# Patient Record
Sex: Female | Born: 1975 | Hispanic: No | Marital: Married | Smoking: Never smoker
Health system: Southern US, Community
[De-identification: ages and names within clinical notes are randomized; demographics above are authoritative.]

## PROBLEM LIST (undated history)

## (undated) ENCOUNTER — Inpatient Hospital Stay (HOSPITAL_COMMUNITY): Payer: Self-pay

## (undated) DIAGNOSIS — O24419 Gestational diabetes mellitus in pregnancy, unspecified control: Secondary | ICD-10-CM

## (undated) HISTORY — PX: INCISION AND DRAINAGE BREAST ABSCESS: SUR672

## (undated) HISTORY — PX: BREAST SURGERY: SHX581

---

## 2010-11-12 DIAGNOSIS — O24419 Gestational diabetes mellitus in pregnancy, unspecified control: Secondary | ICD-10-CM

## 2010-11-12 HISTORY — DX: Gestational diabetes mellitus in pregnancy, unspecified control: O24.419

## 2013-02-01 ENCOUNTER — Encounter (HOSPITAL_COMMUNITY): Payer: Self-pay | Admitting: *Deleted

## 2013-02-01 ENCOUNTER — Emergency Department (INDEPENDENT_AMBULATORY_CARE_PROVIDER_SITE_OTHER)
Admission: EM | Admit: 2013-02-01 | Discharge: 2013-02-01 | Disposition: A | Discharge status: Home / Self Care | Payer: Self-pay | Source: Home / Self Care | Attending: Family Medicine | Admitting: Family Medicine

## 2013-02-01 DIAGNOSIS — S335XXA Sprain of ligaments of lumbar spine, initial encounter: Secondary | ICD-10-CM

## 2013-02-01 DIAGNOSIS — S39012A Strain of muscle, fascia and tendon of lower back, initial encounter: Secondary | ICD-10-CM

## 2013-02-01 MED ORDER — METAXALONE 800 MG PO TABS: 800.0000 mg | ORAL_TABLET | Freq: Three times a day (TID) | ORAL | Status: DC

## 2013-02-01 NOTE — ED Notes (Signed)
Pt  Reports       Pain l  Side       Hurts      When  She  Bends     And    Picks  Something up         She  denys  Any injury  She  denys  Any  Urinary  Symptoms           She  Is  Sitting  Upright on  Exam table  Speaking in  Complete  sentances

## 2013-02-01 NOTE — ED Provider Notes (Signed)
History     CSN: [id number]  Arrival date & time 02/01/13  1211   First MD Initiated Contact with Patient 02/01/13 1218      Chief Complaint  Patient presents with  . Back Pain    (Consider location/radiation/quality/duration/timing/severity/associated sxs/prior treatment) Patient is a 37 y.o. female presenting with back pain. The history is provided by the patient.  Back Pain Location:  Sacro-iliac joint Quality:  Burning and stiffness Radiates to:  Does not radiate Pain severity:  Mild Duration:  3 days Progression:  Unchanged Context comment:  Onset when cleaning house.   History reviewed. No pertinent past medical history.  History reviewed. No pertinent past surgical history.  History reviewed. No pertinent family history.  History  Substance Use Topics  . Smoking status: Never Smoker   . Smokeless tobacco: Not on file  . Alcohol Use: No    OB History   Grav Para Term Preterm Abortions TAB SAB Ect Mult Living                  Review of Systems  Constitutional: Negative.   Gastrointestinal: Negative.   Genitourinary: Negative.   Musculoskeletal: Positive for back pain. Negative for myalgias.  Skin: Negative.     Allergies  Review of patient's allergies indicates not on file.  Home Medications   Current Outpatient Rx  Name  Route  Sig  Dispense  Refill  . metaxalone (SKELAXIN) 800 MG tablet   Oral   Take 1 tablet (800 mg total) by mouth 3 (three) times daily.   21 tablet   0     BP 124/82  Pulse 70  Temp(Src) 98.4 F (36.9 C) (Oral)  SpO2 100%  Physical Exam  Nursing note and vitals reviewed. Constitutional: She is oriented to person, place, and time. She appears well-developed and well-nourished.  Neck: Normal range of motion. Neck supple.  Abdominal: Soft. Bowel sounds are normal.  Musculoskeletal: She exhibits tenderness.       Lumbar back: She exhibits tenderness and spasm. She exhibits normal range of motion, no bony tenderness,  no pain and normal pulse.       Back:  Neurological: She is alert and oriented to person, place, and time.  Skin: Skin is warm and dry.    ED Course  Procedures (including critical care time)  Labs Reviewed - No data to display No results found.   1. Lumbar strain, initial encounter       MDM          Linna Hoff, MD 02/01/13 1345

## 2013-07-23 ENCOUNTER — Encounter (HOSPITAL_COMMUNITY): Payer: Self-pay | Admitting: Emergency Medicine

## 2013-07-23 ENCOUNTER — Emergency Department (HOSPITAL_COMMUNITY)
Admission: EM | Admit: 2013-07-23 | Discharge: 2013-07-23 | Disposition: A | Discharge status: Home / Self Care | Payer: No Typology Code available for payment source | Source: Home / Self Care | Attending: Family Medicine | Admitting: Family Medicine

## 2013-07-23 DIAGNOSIS — N63 Unspecified lump in unspecified breast: Secondary | ICD-10-CM

## 2013-07-23 DIAGNOSIS — M7710 Lateral epicondylitis, unspecified elbow: Secondary | ICD-10-CM

## 2013-07-23 DIAGNOSIS — N6320 Unspecified lump in the left breast, unspecified quadrant: Secondary | ICD-10-CM

## 2013-07-23 DIAGNOSIS — M7712 Lateral epicondylitis, left elbow: Secondary | ICD-10-CM

## 2013-07-23 LAB — POCT URINALYSIS DIP (DEVICE)
Bilirubin Urine: NEGATIVE
Hgb urine dipstick: NEGATIVE
Nitrite: NEGATIVE
Specific Gravity, Urine: >=1.03 (ref 1.005–1.030)
Urobilinogen, UA: 0.2 mg/dL (ref 0.0–1.0)
pH: 5.5 (ref 5.0–8.0)

## 2013-07-23 MED ORDER — MELOXICAM 15 MG PO TABS: 15.0000 mg | ORAL_TABLET | Freq: Every day | ORAL | Status: DC | PRN

## 2013-07-23 MED ORDER — DOXYCYCLINE HYCLATE 100 MG PO CAPS: 100.0000 mg | ORAL_CAPSULE | Freq: Two times a day (BID) | ORAL | Status: DC

## 2013-07-23 NOTE — ED Notes (Signed)
Pt c/o mass on left side of breast onset 2 days Sxs include pain that increases when she puts pressure on it... Denies breast d/c Also c/o left arm pain onset 2 days as well Pain increases when lifting heavy objects Denies: SOB, HA, edema, CP, inj/trauma, strenuous activity Alert w/no signs of acute distress.

## 2013-07-23 NOTE — ED Provider Notes (Signed)
Shannon Dennis is a 37 y.o. female who presents to Urgent Care today for left breast pain and left lateral elbow pain.  1) patient noted a breast mass about 2 days ago. It is tender and on the lateral aspect of her left breast. She denies any injury. She denies any nipple discharge. She feels well otherwise with no chest pain palpitations or shortness of breath. She's never had a mammogram or breast mass before.  2) left elbow pain: Patient has a few days of left lateral elbow pain. The pain is worse with wrist extension and hand motion. She denies any radiating pain weakness or numbness. He has not used any medications for this yet.    History reviewed. No pertinent past medical history. History  Substance Use Topics  . Smoking status: Never Smoker   . Smokeless tobacco: Not on file  . Alcohol Use: No   ROS as above Medications reviewed. No current facility-administered medications for this encounter.   Current Outpatient Prescriptions  Medication Sig Dispense Refill  . doxycycline (VIBRAMYCIN) 100 MG capsule Take 1 capsule (100 mg total) by mouth 2 (two) times daily.  20 capsule  0  . meloxicam (MOBIC) 15 MG tablet Take 1 tablet (15 mg total) by mouth daily as needed for pain.  14 tablet  0    Exam:  BP 134/85  Pulse 84  Temp(Src) 98.4 F (36.9 C) (Oral)  Resp 16  SpO2 99% Gen: Well NAD HEENT: EOMI,  MMM Lungs: CTABL Nl WOB Breast: Symmetrical appearing bilaterally. Large firm nontender mobile mildly tender mass in the left breast. It occurs at approximately the 4:00 position. Mildly tender.  Heart: RRR no MRG Abd: NABS, NT, ND Exts: Non edematous BL  LE, warm and well perfused.  Left elbow: Normal-appearing. Tender palpation of the lateral epicondyles. Pain with resisted wrist extension Capillary Refill and sensation grip strength are intact bilateral upper extremities  Results for orders placed during the hospital encounter of 07/23/13 (from the past 24 hour(s))  POCT  URINALYSIS DIP (DEVICE)     Status: None   Collection Time    07/23/13 12:43 PM      Result Value Range   Glucose, UA NEGATIVE  NEGATIVE mg/dL   Bilirubin Urine NEGATIVE  NEGATIVE   Ketones, ur NEGATIVE  NEGATIVE mg/dL   Specific Gravity, Urine >=1.030  1.005 - 1.030   Hgb urine dipstick NEGATIVE  NEGATIVE   pH 5.5  5.0 - 8.0   Protein, ur NEGATIVE  NEGATIVE mg/dL   Urobilinogen, UA 0.2  0.0 - 1.0 mg/dL   Nitrite NEGATIVE  NEGATIVE   Leukocytes, UA NEGATIVE  NEGATIVE  POCT PREGNANCY, URINE     Status: None   Collection Time    07/23/13 12:54 PM      Result Value Range   Preg Test, Ur NEGATIVE  NEGATIVE   No results found.  Twelve-lead EKG shows normal sinus rhythm at 79 beats per minute. No significant abnormalities noted.  Assessment and Plan: 37 y.o. female with  1)  Breast mass: Schedule patient for a diagnostic mammogram and ultrasound to evaluate the cause of the breast mass.Marland Kitchen it may be abscess or cellulitis. Plan to treat with doxycycline.  2) left lateral epicondylitis: Plan to treat with meloxicam.  Followup as needed Discussed warning signs or symptoms. Please see discharge instructions. Patient expresses understanding.      Rodolph Bong, MD 07/23/13 1308

## 2013-07-30 ENCOUNTER — Other Ambulatory Visit (HOSPITAL_COMMUNITY): Payer: Self-pay | Admitting: Family Medicine

## 2013-07-30 DIAGNOSIS — N6320 Unspecified lump in the left breast, unspecified quadrant: Secondary | ICD-10-CM

## 2013-07-30 NOTE — ED Notes (Signed)
I called the [facility] and they said they got the verbal order from Dr. Denyse Amass and they entered it but he needs to sign it. I told her I would ask him to sign it.  I called Dr. Denyse Amass and he said it was not in his work que to sign.  I told him I would fill out our paper order and he said he would come back  to sign it.  He signed it and I faxed it to [phone] and confirmation received. [name] 07/30/2013

## 2013-07-30 NOTE — ED Notes (Signed)
Pt. came to the [facility] and said her husband went to the pharmacy and they told him we did not e-prescribe her Rx.'s She also got a new phone number and has not been called for a mammogram appt.  I accessed pt.'s chart and saw that pt. got printed Rx.'s for Mobic and Doxcycline said normal but no pharmacy.  I told registration clerk Clotilde Dieter to give her the number of the [facility] and instruct her to call them, because they most likely got the order, but were unable to contact her.  I asked her if she got a purple paper with and Rx on the back with her discharge instructions. She said yes. I told her that is the Rx. she takes to the pharmacy. I instructed to call the [facility] and schedule an appt. If any further problems with her Rx.'s or mammogram appt. to call back. [name] 07/30/2013

## 2013-08-13 ENCOUNTER — Other Ambulatory Visit: Payer: Self-pay

## 2013-08-18 ENCOUNTER — Ambulatory Visit (HOSPITAL_COMMUNITY)
Admission: RE | Admit: 2013-08-18 | Discharge: 2013-08-18 | Disposition: A | Discharge status: Home / Self Care | Payer: Self-pay | Source: Ambulatory Visit | Attending: Obstetrics and Gynecology | Admitting: Obstetrics and Gynecology

## 2013-08-18 ENCOUNTER — Encounter (HOSPITAL_COMMUNITY): Payer: Self-pay

## 2013-08-18 VITALS — BP 118/70 | Temp 99.0°F | Ht 62.99 in | Wt 180.8 lb

## 2013-08-18 DIAGNOSIS — Z1239 Encounter for other screening for malignant neoplasm of breast: Secondary | ICD-10-CM

## 2013-08-18 NOTE — Patient Instructions (Signed)
Taught Shannon Dennis how to perform BSE and gave educational materials to take home. Patient did not need a Pap smear today due to last Pap smear was March 2014 per patient. Let her know [facility] will cover Pap smears every 3 years unless has a history of abnormal Pap smears. Referred patient to the [facility] for diagnostic mammogram and left breast ultrasound. Appointment scheduled for Thursday, August 20, 2013 at 1515. Patient aware of appointment and will be there. [name] verbalized understanding.  [name], [name], RN 4:47 PM

## 2013-08-18 NOTE — Progress Notes (Signed)
Complaints of left breast lump x 2-3 weeks that per patient feels a mild pain if pushed.  Pap Smear:    Pap smear not completed today. Last Pap smear was March 2014 at the [facility] and normal per patient. Per patient has no history of an abnormal Pap smear. No Pap smear results in EPIC.  Physical exam: Breasts Right breast larger than left breast. No skin abnormalities bilateral breasts. No nipple retraction right breast. Left nipple slightly inverted. No nipple discharge bilateral breasts. No lymphadenopathy. No lumps palpated right breast. Palpated a lump within the left breast at 3 o'clock 2 cm from the nipple.  No complaints of pain or tenderness on exam. Referred patient to the [facility] for diagnostic mammogram and left breast ultrasound. Appointment scheduled for Thursday, August 20, 2013 at 1515.  Pelvic/Bimanual No Pap smear completed today since last Pap smear was March 2014 per patient. Pap smear not indicated per [facility] guidelines.

## 2013-08-20 ENCOUNTER — Other Ambulatory Visit (HOSPITAL_COMMUNITY): Payer: Self-pay | Admitting: Family Medicine

## 2013-08-20 ENCOUNTER — Ambulatory Visit
Admission: RE | Admit: 2013-08-20 | Discharge: 2013-08-20 | Disposition: A | Discharge status: Home / Self Care | Payer: No Typology Code available for payment source | Source: Ambulatory Visit | Attending: Family Medicine | Admitting: Family Medicine

## 2013-08-20 DIAGNOSIS — N6320 Unspecified lump in the left breast, unspecified quadrant: Secondary | ICD-10-CM

## 2013-08-24 ENCOUNTER — Other Ambulatory Visit: Payer: Self-pay | Admitting: Obstetrics and Gynecology

## 2013-08-24 DIAGNOSIS — N6320 Unspecified lump in the left breast, unspecified quadrant: Secondary | ICD-10-CM

## 2013-08-25 ENCOUNTER — Ambulatory Visit
Admission: RE | Admit: 2013-08-25 | Discharge: 2013-08-25 | Disposition: A | Discharge status: Home / Self Care | Payer: No Typology Code available for payment source | Source: Ambulatory Visit | Attending: Family Medicine | Admitting: Family Medicine

## 2013-08-25 DIAGNOSIS — N6320 Unspecified lump in the left breast, unspecified quadrant: Secondary | ICD-10-CM

## 2013-09-09 ENCOUNTER — Telehealth (HOSPITAL_COMMUNITY): Payer: Self-pay | Admitting: *Deleted

## 2013-09-09 NOTE — Telephone Encounter (Signed)
Telephoned patient at home # and left message to return call to [facility] 

## 2013-09-10 ENCOUNTER — Other Ambulatory Visit: Payer: Self-pay | Admitting: Obstetrics and Gynecology

## 2013-09-10 ENCOUNTER — Ambulatory Visit: Payer: No Typology Code available for payment source | Attending: Internal Medicine

## 2013-09-10 DIAGNOSIS — N6320 Unspecified lump in the left breast, unspecified quadrant: Secondary | ICD-10-CM

## 2013-09-14 ENCOUNTER — Ambulatory Visit
Admission: RE | Admit: 2013-09-14 | Discharge: 2013-09-14 | Disposition: A | Discharge status: Home / Self Care | Payer: No Typology Code available for payment source | Source: Ambulatory Visit | Attending: Obstetrics and Gynecology | Admitting: Obstetrics and Gynecology

## 2013-09-14 DIAGNOSIS — N6320 Unspecified lump in the left breast, unspecified quadrant: Secondary | ICD-10-CM

## 2013-09-15 ENCOUNTER — Encounter (INDEPENDENT_AMBULATORY_CARE_PROVIDER_SITE_OTHER): Payer: Self-pay | Admitting: General Surgery

## 2013-09-15 ENCOUNTER — Ambulatory Visit (INDEPENDENT_AMBULATORY_CARE_PROVIDER_SITE_OTHER): Payer: Self-pay | Admitting: General Surgery

## 2013-09-15 VITALS — BP 128/72 | HR 60 | Temp 98.0°F | Resp 18 | Ht 63.0 in | Wt 183.0 lb

## 2013-09-15 DIAGNOSIS — N63 Unspecified lump in unspecified breast: Secondary | ICD-10-CM

## 2013-09-15 NOTE — Progress Notes (Signed)
Patient ID: Shannon Dennis, female   DOB: 08-Apr-1976, 37 y.o.   MRN: [id number]  Chief Complaint  Patient presents with  . Follow-up    left breast cyst    HPI Shannon Dennis is a 37 y.o. female.   HPI Patient is a 37 year old female who presents with around 2 months of a palpable left breast mass. She underwent mammogram and ultrasound. She was found to have a 2.6 cm mass at 3:00 and and axillary lymph node. These were biopsied and were both benign. The patient was found to have dense inflammation and granulomatous changes with fat necrosis.  She has had 2 courses of antibiotics without change in the size of the mass. She denies any significant pain in this region unless it gets bumped. She denies any prior history of trauma.  She has a history of breast cancer or family members with breast cancer.  No past medical history on file.  Past Surgical History  Procedure Laterality Date  . Cesarean section      one previous    Family History  Problem Relation Age of Onset  . Diabetes Mother   . Hypertension Mother   . Diabetes Father   . Hypertension Father     Social History History  Substance Use Topics  . Smoking status: Never Smoker   . Smokeless tobacco: Never Used  . Alcohol Use: No    No Known Allergies  Current Outpatient Prescriptions  Medication Sig Dispense Refill  . Multiple Vitamin (MULTIVITAMIN) tablet Take 1 tablet by mouth daily.      Marland Kitchen doxycycline (VIBRAMYCIN) 100 MG capsule Take 1 capsule (100 mg total) by mouth 2 (two) times daily.  20 capsule  0  . meloxicam (MOBIC) 15 MG tablet Take 1 tablet (15 mg total) by mouth daily as needed for pain.  14 tablet  0   No current facility-administered medications for this visit.    Review of Systems Review of Systems  All other systems reviewed and are negative.    Blood pressure 128/72, pulse 60, temperature 98 F (36.7 C), resp. rate 18, height 5\' 3"  (1.6 m), weight 183 lb (83.008 kg).  Physical Exam Physical Exam    Constitutional: She is oriented to person, place, and time. She appears well-developed and well-nourished. No distress.  HENT:  Head: Normocephalic and atraumatic.  Wearing head scarf  Eyes: Conjunctivae are normal. Pupils are equal, round, and reactive to light. No scleral icterus.  Neck: Normal range of motion. Neck supple. No tracheal deviation present. No thyromegaly present.  Cardiovascular: Normal rate, regular rhythm, normal heart sounds and intact distal pulses.  Exam reveals no gallop and no friction rub.   No murmur heard. Pulmonary/Chest: Effort normal and breath sounds normal. No respiratory distress. She has no wheezes. She has no rales. She exhibits no tenderness. Right breast exhibits no inverted nipple, no mass, no nipple discharge, no skin change and no tenderness. Left breast exhibits mass and tenderness (minimal tenderness). Left breast exhibits no inverted nipple, no nipple discharge and no skin change. Breasts are symmetrical.    Dense breast tissue bilaterally.  Abdominal: Soft. Bowel sounds are normal. She exhibits no distension and no mass. There is no tenderness. There is no rebound and no guarding.  Musculoskeletal: Normal range of motion. She exhibits no edema and no tenderness.  Lymphadenopathy:    She has no cervical adenopathy.  Neurological: She is alert and oriented to person, place, and time.  Skin: Skin is warm and dry.  No rash noted. She is not diaphoretic. No erythema. No pallor.  ? Henna tattoos on feet and hands.    Psychiatric: She has a normal mood and affect. Her behavior is normal. Judgment and thought content normal.      Data Reviewed paathology Diagnosis 1. Breast, left, needle core biopsy, outer 1/2 - DENSELY INFLAMED BREAST PARENCHYMA WITH FOCAL FAT NECROSIS, CONSISTENT WITH ABSCESS, WITH VAGUE GRANULOMATA. - THERE IS NO EVIDENCE OF MALIGNANCY. - SEE COMMENT. 2. Lymph node, needle/core biopsy, left axillary - BENIGN LYMPH NODAL  TISSUE. - THERE IS NO EVIDENCE OF MALIGNANCY. - SEE COMMENT.  Ultrasound COMPARISON: 08/20/13  FINDINGS:  On physical exam, there is no skin redness or calor. There remains a  firm palpable mass in the 4 o'clock position of the left breast 4cm  from the nipple.  Ultrasound is performed, showing irregular mildly hypervascular  hypoechoic tissue with architectural distortion in the 4 o'clock  position of the left breast. It measures. 24 x 18 x 41mm. It is not  significantly changed when compared to prior images.  IMPRESSION:  No significant improvement following a course of antibiotics.    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  Given her lack of severe symptoms and the benign biopsy, I will recheck this in 4-6 weeks.    She is now off antibiotics.  I would like to see what happens from a natural history standpoint to this mass.    I may elect to do a short course of steroids, depending on symptoms and examination.     ba   Shannon Dennis 09/15/2013, 3:16 PM

## 2013-09-15 NOTE — Assessment & Plan Note (Signed)
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  Given her lack of severe symptoms and the benign biopsy, I will recheck this in 4-6 weeks.    She is now off antibiotics.  I would like to see what happens from a natural history standpoint to this mass.    I may elect to do a short course of steroids, depending on symptoms and examination.

## 2013-09-15 NOTE — Patient Instructions (Signed)
Call if you have increased symptoms.  Stay off antibiotics for now.    OK to take ibuprofen or tylenol.

## 2013-10-05 ENCOUNTER — Encounter (INDEPENDENT_AMBULATORY_CARE_PROVIDER_SITE_OTHER): Payer: PRIVATE HEALTH INSURANCE | Admitting: General Surgery

## 2013-10-06 ENCOUNTER — Encounter (INDEPENDENT_AMBULATORY_CARE_PROVIDER_SITE_OTHER): Payer: Self-pay | Admitting: Surgery

## 2013-10-06 ENCOUNTER — Ambulatory Visit (INDEPENDENT_AMBULATORY_CARE_PROVIDER_SITE_OTHER): Payer: Self-pay | Admitting: Surgery

## 2013-10-06 VITALS — BP 122/68 | HR 74 | Temp 97.6°F | Resp 16 | Ht 65.0 in | Wt 180.0 lb

## 2013-10-06 DIAGNOSIS — N611 Abscess of the breast and nipple: Secondary | ICD-10-CM

## 2013-10-06 DIAGNOSIS — N61 Mastitis without abscess: Secondary | ICD-10-CM

## 2013-10-06 MED ORDER — DOXYCYCLINE HYCLATE 50 MG PO CAPS: 50.0000 mg | ORAL_CAPSULE | Freq: Two times a day (BID) | ORAL | Status: DC

## 2013-10-06 MED ORDER — OXYCODONE-ACETAMINOPHEN 5-325 MG PO TABS: 1.0000 | ORAL_TABLET | Freq: Four times a day (QID) | ORAL | Status: DC | PRN

## 2013-10-06 NOTE — Patient Instructions (Signed)
Mastitis  Mastitis is a bacterial infection of the breast tissue. CAUSES  Bacteria causes infection by entering the breast tissue through cuts or openings in the skin. Typically, this occurs with breastfeeding due to cracked or irritated skin. It can be associated with plugged ducts. Nipple piercing can also lead to mastitis. SYMPTOMS  In mastitis, an area of the breast becomes swollen, red, tender, and painful. You may notice you have a fever and swelling of the glands under your arm on that side. If the infection is allowed to progress, a collection of pus (abscess) may develop. DIAGNOSIS  Your caregiver can diagnose mastitis based on your symptoms and upon examination. The diagnosis can be confirmed if pus can be expressed from the breast. This pus can be examined in the lab to determine which bacteria are present. If an abscess has developed, the fluid in the abscess can be removed with a needle. This is used to confirm the diagnosis and determine the bacteria present. In most cases, pus will not be present. Blood tests can be done to determine if your body is fighting a bacterial infection. Sometimes, a mammogram or ultrasound will be recommended to exclude other breast diseases including cancer. Other rare forms of mastitis:  Tuberculosis mastitis is rare. The TB germ can affect the breast if it is present in some other part of the body. The breast may be slightly tender with a mass, but not tender or painful.  Syphilis of the nipple usually has an ulcer that is not tender.  Actinomycosis is a very rare bacterial infection of the breast that presents as a mass in the breast that is not tender or painful.  Phlebitis (inflammation of blood vessels) of the breast is an inflammation of the veins in the breast. It may be caused by tight fitting bras, surgery, or trauma to the breast.  Inflammatory carcinoma of the breast looks like mastitis because the breasts are red, swollen, or tender, but it  is a rare form of breast cancer. TREATMENT  Antibiotic medication is used to treat the bacterial infection. Your caregiver will determine which bacteria are most likely to be causing the infection and select an antibiotic. This is sometimes changed based on the results of cultures, or if there is no response to the antibiotic selected. Antibiotics are usually given by mouth. If you are breastfeeding, it is important to continue to empty the breast. Your caregiver can tell you whether or not this milk is safe for your infant, or needs to be thrown away. Pain can usually be treated with medication. HOME CARE INSTRUCTIONS   Take your antibiotics as directed. Finish them even if you start to feel better.  Only take over-the-counter or prescription medication for pain, discomfort, or fever as directed by your caregiver.  If breastfeeding, keep your nipples clean and dry. Your caregiver may tell you to stop nursing until he or she feels it is safe for your baby. Use a breast pump as instructed if forced to stop nursing.  Do not wear a tight bra. Wear a good support bra.  Empty the first breast completely before going to the other breast. If your baby is not emptying your breasts completely for some reason, use a breast pump to empty your breasts.  If you go back to work, pump your breasts while at work to stay in time with your nursing schedule.  Increase your fluid intake especially if you have a fever.  Avoid having your breasts get overly   filled with milk (engorged). SEEK MEDICAL CARE IF:   You develop pus-like (purulent) discharge from the breast.  Your symptoms get worse.  You do not seem to be responding to your treatment within 2 days. SEEK IMMEDIATE MEDICAL CARE IF:   You have a fever.  Your pain and swelling is getting worse.  You develop pain that is not controlled with medicine.  You develop a red line extending from the breast toward your armpit. Document Released:  10/29/2005 Document Revised: 01/21/2012 Document Reviewed: 05/29/2013 [employer] Patient Information 2014 [employer].  

## 2013-10-06 NOTE — Progress Notes (Signed)
Subjective:     Patient ID: Shannon Dennis, female   DOB: 08-05-1976, 37 y.o.   MRN: [id number]  HPI Patient presents due to left breast redness, swelling and discomfort at the 3:00 position. She has seen Dr. Donell Beers for an inflammatory mass of the left breast was biopsied earlier this month which showed granulomatous changes without malignancy. She's been off antibiotics and the areas become more red, fluctuant and painful. Denies fever or chills.  Review of Systems  Constitutional: Negative for fever.  Neurological: Negative.   Hematological: Negative.   Psychiatric/Behavioral: Negative.        Objective:   Physical Exam  Constitutional: She appears well-developed and well-nourished.  HENT:  Head: Normocephalic and atraumatic.  Pulmonary/Chest:    Skin: Skin is warm and dry.  Psychiatric: She has a normal mood and affect. Her behavior is normal. Thought content normal.       Assessment:     Left breast abscess with history of left breast inflammatory mass and mastitis    Plan:     Recommend aspirations in the office with possible incision and drainage of left breast abscess. Discussed with patient she agrees to proceed. Under sterile conditions the left breast was cleaned with Betadine. 1% lidocaine was used for local anesthesia. In the left lateral breast there is an area of fluctuance measuring a centimeter. This was aspirated in thick yellow material was removed a total of 1 cc. 11 blade was used to make a 5 mm incision and further material was expressed. Dry dressing applied. Patient tolerated procedure well. We'll place back on doxycycline 100  milligrams by mouth twice a day and Percocet 1-2 tab every 4 when necessary pain. Return to clinic next week to see Dr. Donell Beers.

## 2013-10-12 ENCOUNTER — Encounter (INDEPENDENT_AMBULATORY_CARE_PROVIDER_SITE_OTHER): Payer: PRIVATE HEALTH INSURANCE | Admitting: Surgery

## 2013-10-12 ENCOUNTER — Encounter (INDEPENDENT_AMBULATORY_CARE_PROVIDER_SITE_OTHER): Payer: Self-pay | Admitting: General Surgery

## 2013-10-12 ENCOUNTER — Ambulatory Visit (INDEPENDENT_AMBULATORY_CARE_PROVIDER_SITE_OTHER): Payer: Self-pay | Admitting: General Surgery

## 2013-10-12 VITALS — BP 128/74 | HR 76 | Temp 97.4°F | Resp 14 | Ht 64.0 in | Wt 179.8 lb

## 2013-10-12 DIAGNOSIS — N63 Unspecified lump in unspecified breast: Secondary | ICD-10-CM

## 2013-10-12 NOTE — Patient Instructions (Signed)
Follow up in 2-3 weeks.    Finish antibiotic.

## 2013-10-13 NOTE — Progress Notes (Signed)
HISTORY: Pt is s/p I&D by Dr. Luisa Hart around 7 days ago.  She is doing much better.  Her pain is improving.      EXAM: General:  Alert and oriented.   Incision:  Healing well.  No residual erythema.  Small incision without purulent drainage.  No drainage expressible.      ASSESSMENT AND PLAN:   Breast lump on left side at 3 o'clock position No residual fluctuance, but still has some induration.    Will follow up in 3-4 weeks to reexamine.        Shannon Diego, MD Surgical Oncology, General & Endocrine Surgery [facility]  No PCP Per Patient No ref. provider found

## 2013-10-13 NOTE — Assessment & Plan Note (Signed)
No residual fluctuance, but still has some induration.    Will follow up in 3-4 weeks to reexamine.

## 2013-10-19 ENCOUNTER — Encounter (INDEPENDENT_AMBULATORY_CARE_PROVIDER_SITE_OTHER): Payer: PRIVATE HEALTH INSURANCE | Admitting: General Surgery

## 2013-10-23 ENCOUNTER — Encounter (INDEPENDENT_AMBULATORY_CARE_PROVIDER_SITE_OTHER): Payer: Self-pay | Admitting: General Surgery

## 2013-10-26 ENCOUNTER — Encounter (INDEPENDENT_AMBULATORY_CARE_PROVIDER_SITE_OTHER): Payer: PRIVATE HEALTH INSURANCE | Admitting: General Surgery

## 2013-10-27 ENCOUNTER — Ambulatory Visit (INDEPENDENT_AMBULATORY_CARE_PROVIDER_SITE_OTHER): Payer: Self-pay | Admitting: General Surgery

## 2013-10-27 ENCOUNTER — Encounter (INDEPENDENT_AMBULATORY_CARE_PROVIDER_SITE_OTHER): Payer: Self-pay | Admitting: General Surgery

## 2013-10-27 VITALS — BP 124/70 | HR 68 | Temp 98.0°F | Resp 18 | Ht 63.0 in | Wt 181.0 lb

## 2013-10-27 DIAGNOSIS — N611 Abscess of the breast and nipple: Secondary | ICD-10-CM

## 2013-10-27 DIAGNOSIS — N61 Mastitis without abscess: Secondary | ICD-10-CM

## 2013-10-27 MED ORDER — SULFAMETHOXAZOLE-TRIMETHOPRIM 400-80 MG PO TABS: 1.0000 | ORAL_TABLET | Freq: Two times a day (BID) | ORAL | Status: AC

## 2013-10-27 NOTE — Patient Instructions (Signed)
Take antibiotic for 10 days.  Follow up in 3 months  Do warm compresses to left breast.

## 2013-10-27 NOTE — Assessment & Plan Note (Signed)
Antibiotics for continued induration.    Follow up in 3 months.  Discussed that since symptoms still mild, would not resect at this time.  She would have high infection risk for surgical site.   If she has recurrent symptoms in 3 months, will readdress surgery.

## 2013-10-27 NOTE — Progress Notes (Signed)
HISTORY: Pt is a 37 yo F with a recent history of breast mass and abscess.  She did require small I&D by Dr. Luisa Hart.  She continues to have some mild soreness and drainage, but no fevers/ chills.  She describes the drainage as bloody.     PERTINENT REVIEW OF SYSTEMS: Otherwise negative times 11.    Filed Vitals:   10/27/13 1435  BP: 124/70  Pulse: 68  Temp: 98 F (36.7 C)  Resp: 18   Filed Weights   10/27/13 1435  Weight: 181 lb (82.101 kg)     EXAM: Head: Normocephalic and atraumatic.  Eyes:  Conjunctivae are normal. Pupils are equal, round, and reactive to light. No scleral icterus.  Resp:  No distress, normal effort.\ Breast:  Left breast without fluctuance.  Still small amount of expressible drainage. Firm area posterior to opening.  That has been biopsied.   Neurological: Alert and oriented to person, place, and time. Coordination normal.  Skin: Skin is warm and dry. No rash noted. No diaphoretic. No erythema. No pallor.  Psychiatric: Normal mood and affect. Normal behavior. Judgment and thought content normal.      ASSESSMENT AND PLAN:   Left breast abscess Antibiotics for continued induration.    Follow up in 3 months.  Discussed that since symptoms still mild, would not resect at this time.  She would have high infection risk for surgical site.   If she has recurrent symptoms in 3 months, will readdress surgery.        Maudry Diego, MD Surgical Oncology, General & Endocrine Surgery [facility]  No PCP Per Patient No ref. provider found

## 2014-01-11 IMAGING — US US LT BREAST BIOPSY
1 series · 13 of 13 positions shown · non-contrast
Comparison: Previous exams.

ADDENDUM:
The PATHOLOGY associated with the left breast ultrasound-guided core
biopsy demonstrated inflammatory changes and a possible breast
abscess. The PATHOLOGY associated with the left axillary lymph node
biopsy demonstrated a benign lymph node. The PATHOLOGY is felt to be
concordant with the imaging findings. I have discussed the findings
with the patient by telephone and answered her questions. The
patient states her biopsy sites are clean and dry without hematoma
formation and mild to moderate tenderness. The patient will be
placed on doxycycline 100 mg p.o. b.i.d. for 10 day course and has
been asked to return to the [REDACTED] in 2-3 weeks for follow-up
ultrasound and physical examination. The patient was encouraged to
call the [REDACTED] for additional questions or concerns and she
has been instructed to contact the [REDACTED] if she develops
increasing fever, redness, pain, or swelling.
CLINICAL DATA: 37-year-old [place] patient, living in the [place] for the past 1-1/2 years, presenting earlier this month with
a palpable lump in the left breast which had suspicious imaging
features. A pathologic appearing left axillary lymph node was also
identified at imaging. Subsequent encounter for biopsy of both
lesions.

EXAM:
ULTRASOUND-GUIDED CORE NEEDLE BIOPSY OF THE LEFT BREAST
ULTRASOUND-GUIDED CORE NEEDLE BIOPSY OF A LEFT AXILLARY LYMPH NODE

[Series 1: breast · 13 of 13 slices shown]
[im 1/13]
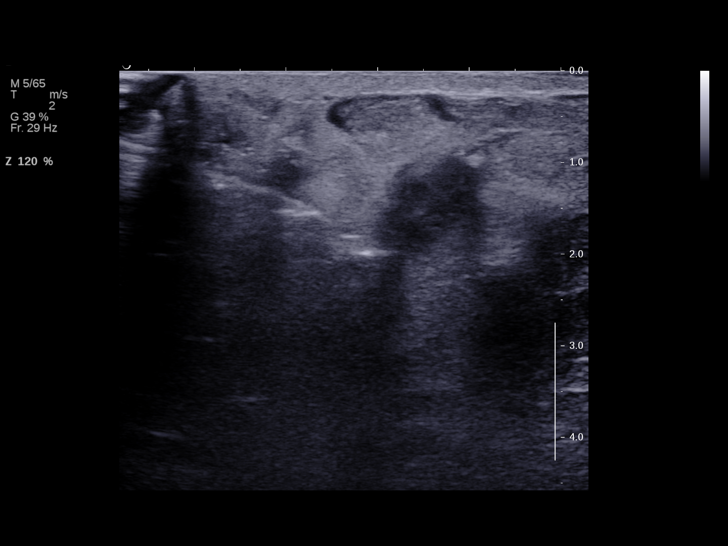
[im 2/13]
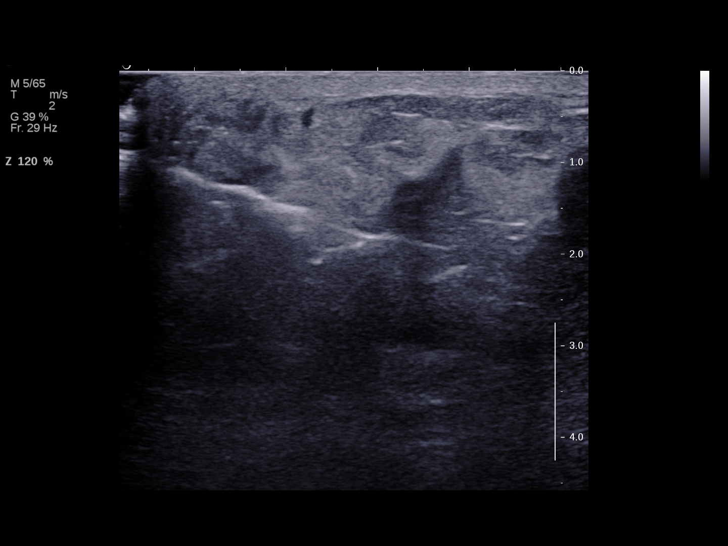
[im 3/13]
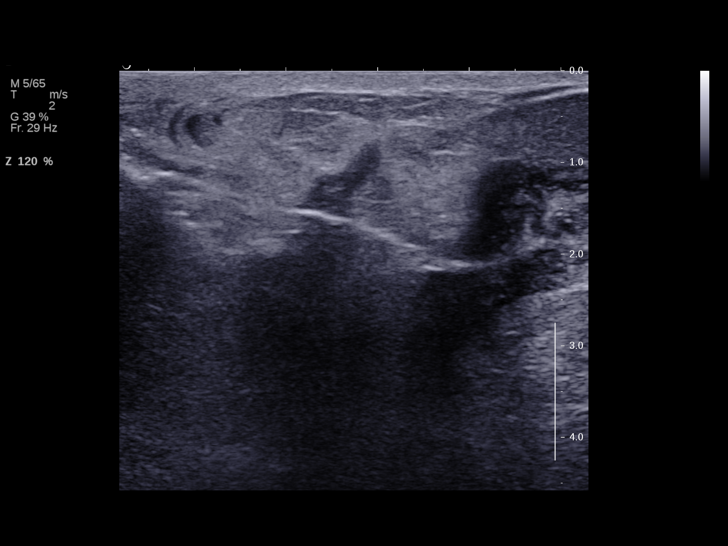
[im 4/13]
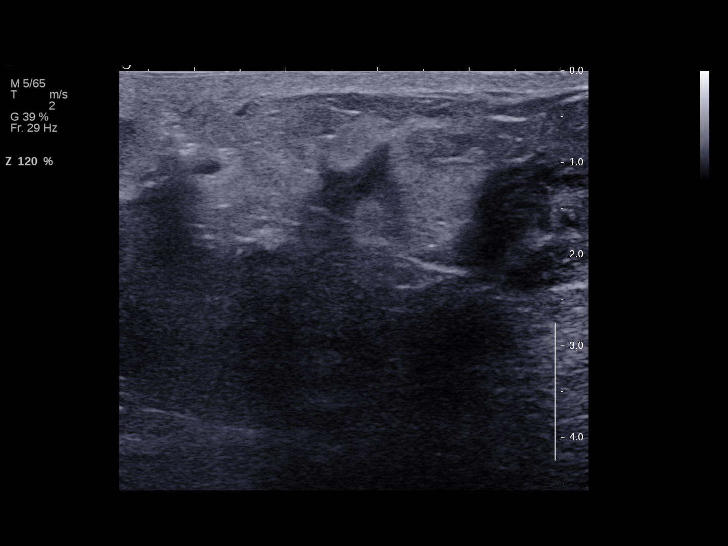
[im 5/13]
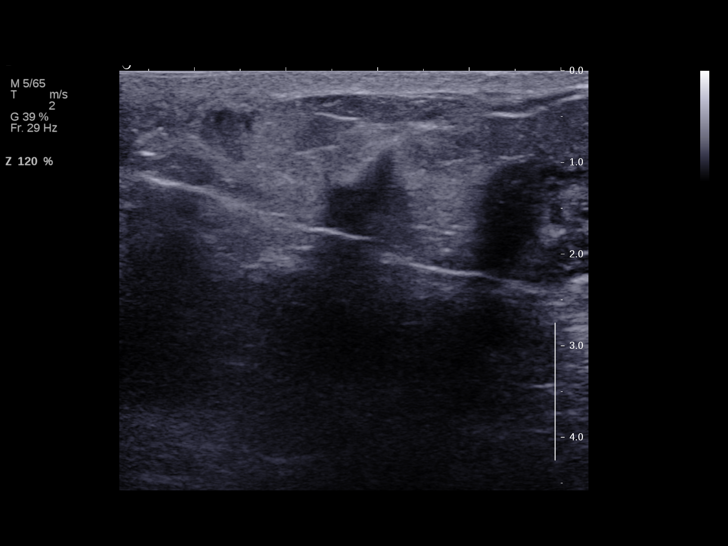
[im 6/13]
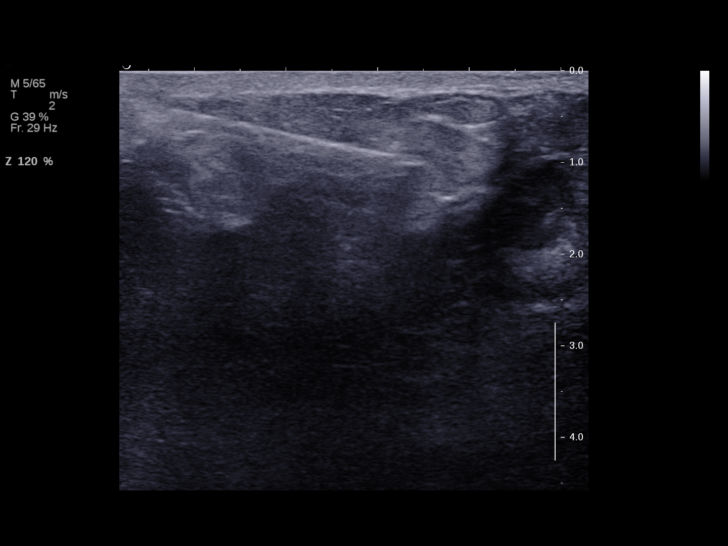
[im 7/13]
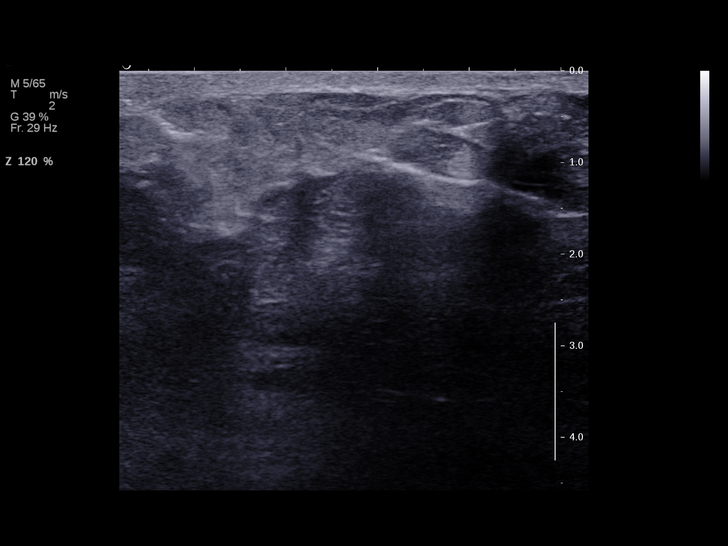
[im 8/13]
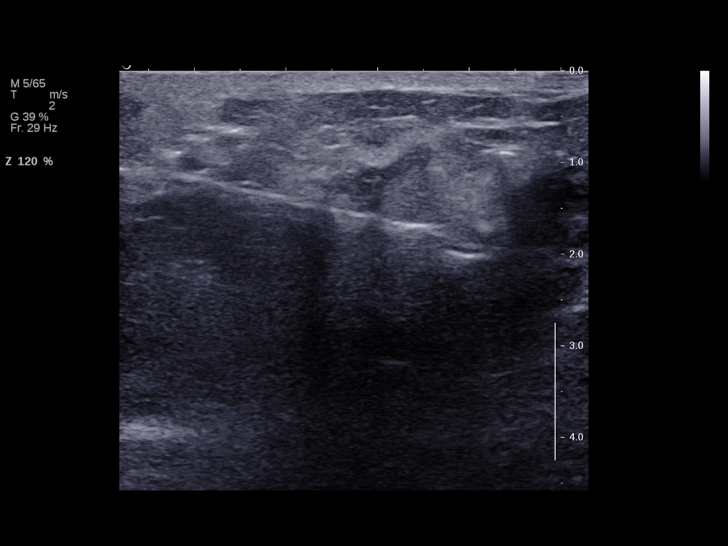
[im 9/13]
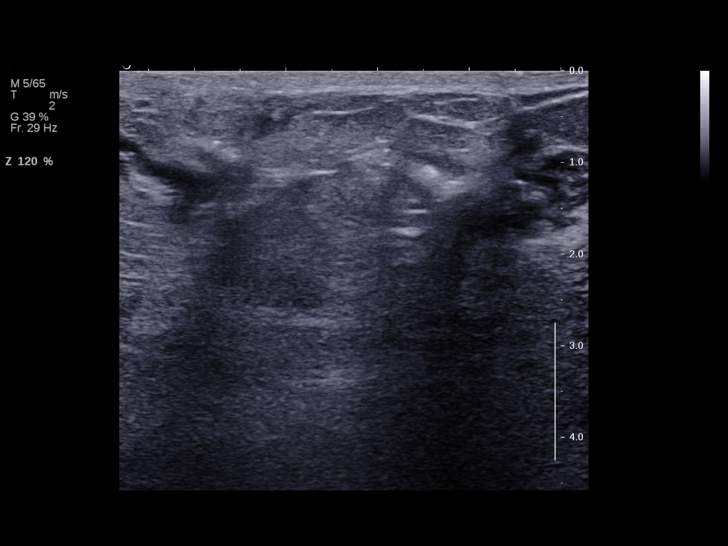
[im 10/13]
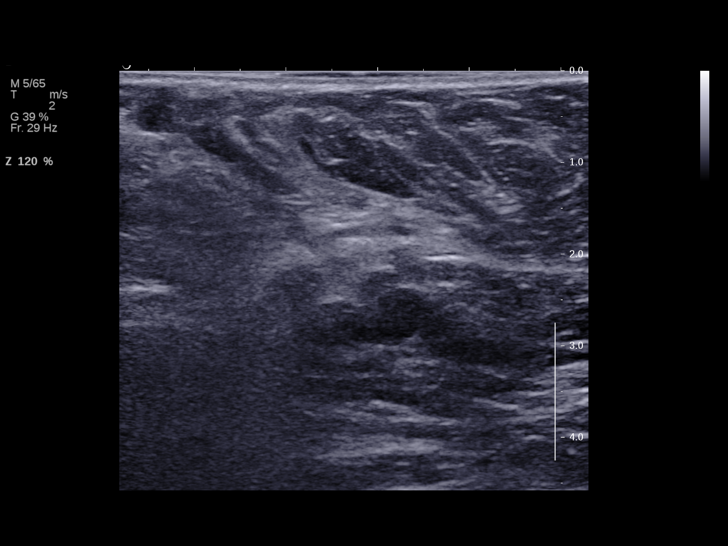
[im 11/13]
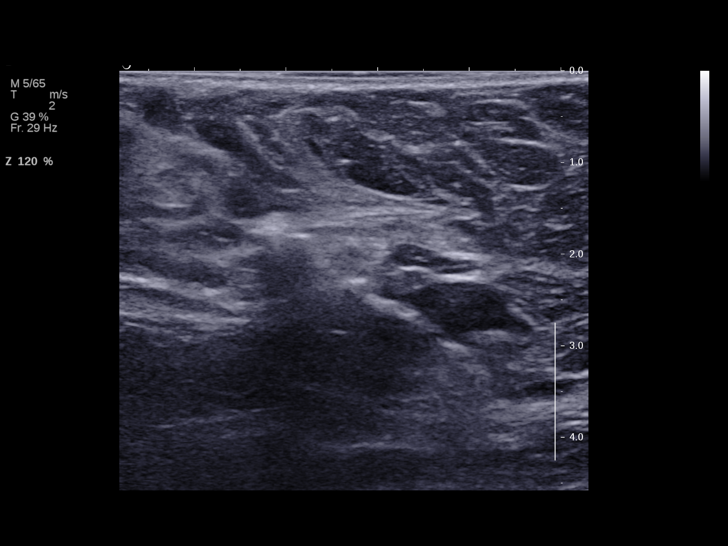
[im 12/13]
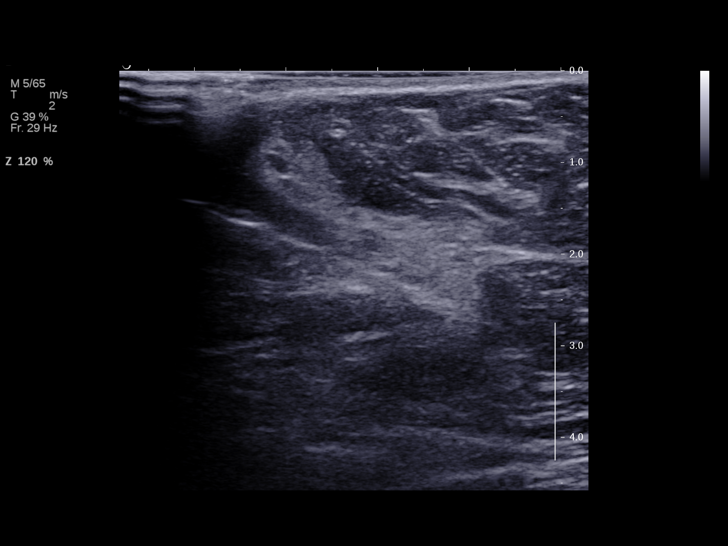
[im 13/13]
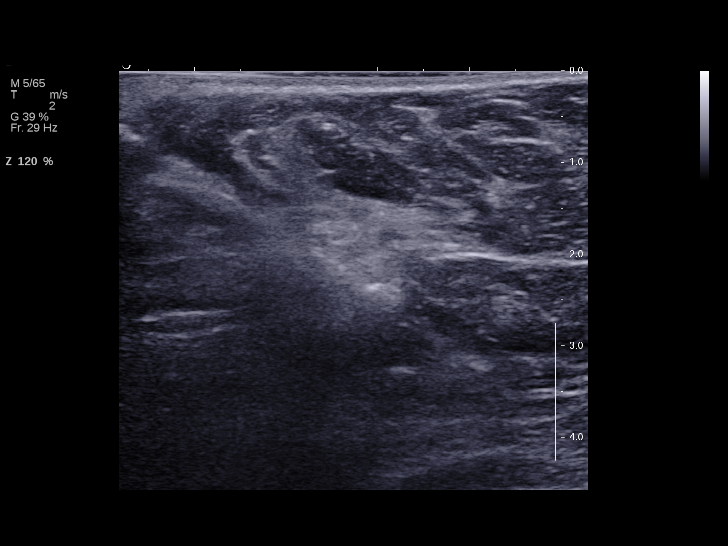

[13 of 13 positions shown; findings below may reference images not displayed]

PROCEDURE:
I met with the patient and we discussed the procedure of
ultrasound-guided biopsy, including benefits and alternatives. We
discussed the high likelihood of a successful procedure. We
discussed the risks of the procedure including infection, bleeding,
tissue injury, clip migration, and inadequate sampling. Informed
written consent was given. The usual time-out protocol was performed
immediately prior to the procedure.

Using sterile technique and 2% Lidocaine as local anesthetic, under
direct ultrasound visualization, a 14 gauge MARNIE core needle device
with a 22 mm throw was used to initially perform biopsy of the
irregular mass involving much of the outer left breast using an
inferior approach. At the conclusion of the procedure, a cylinder
shaped tissue marker clip was deployed into the biopsy cavity.

Subsequent, using a similar technique, the abnormal left axillary
lymph node was biopsied using a 14 gauge MARNIE core needle device with
a 15 mm throw.
IMPRESSION: Ultrasound-guided biopsy of the irregular mass involving much of the
outer left breast and a pathologic left axillary lymph node as
described. No apparent complications.

## 2014-01-11 IMAGING — MG MM DIAGNOSTIC UNILATERAL L
2 series · 2 of 2 positions shown · non-contrast
Comparison: Previous exams

CLINICAL DATA: Subsequent encounter for clip placement after
ultrasound guided biopsy of a mass encompassing much of the outer
left breast.

EXAM:
DIGITAL DIAGNOSTIC UNILATERAL LEFT MAMMOGRAM

[L CC]
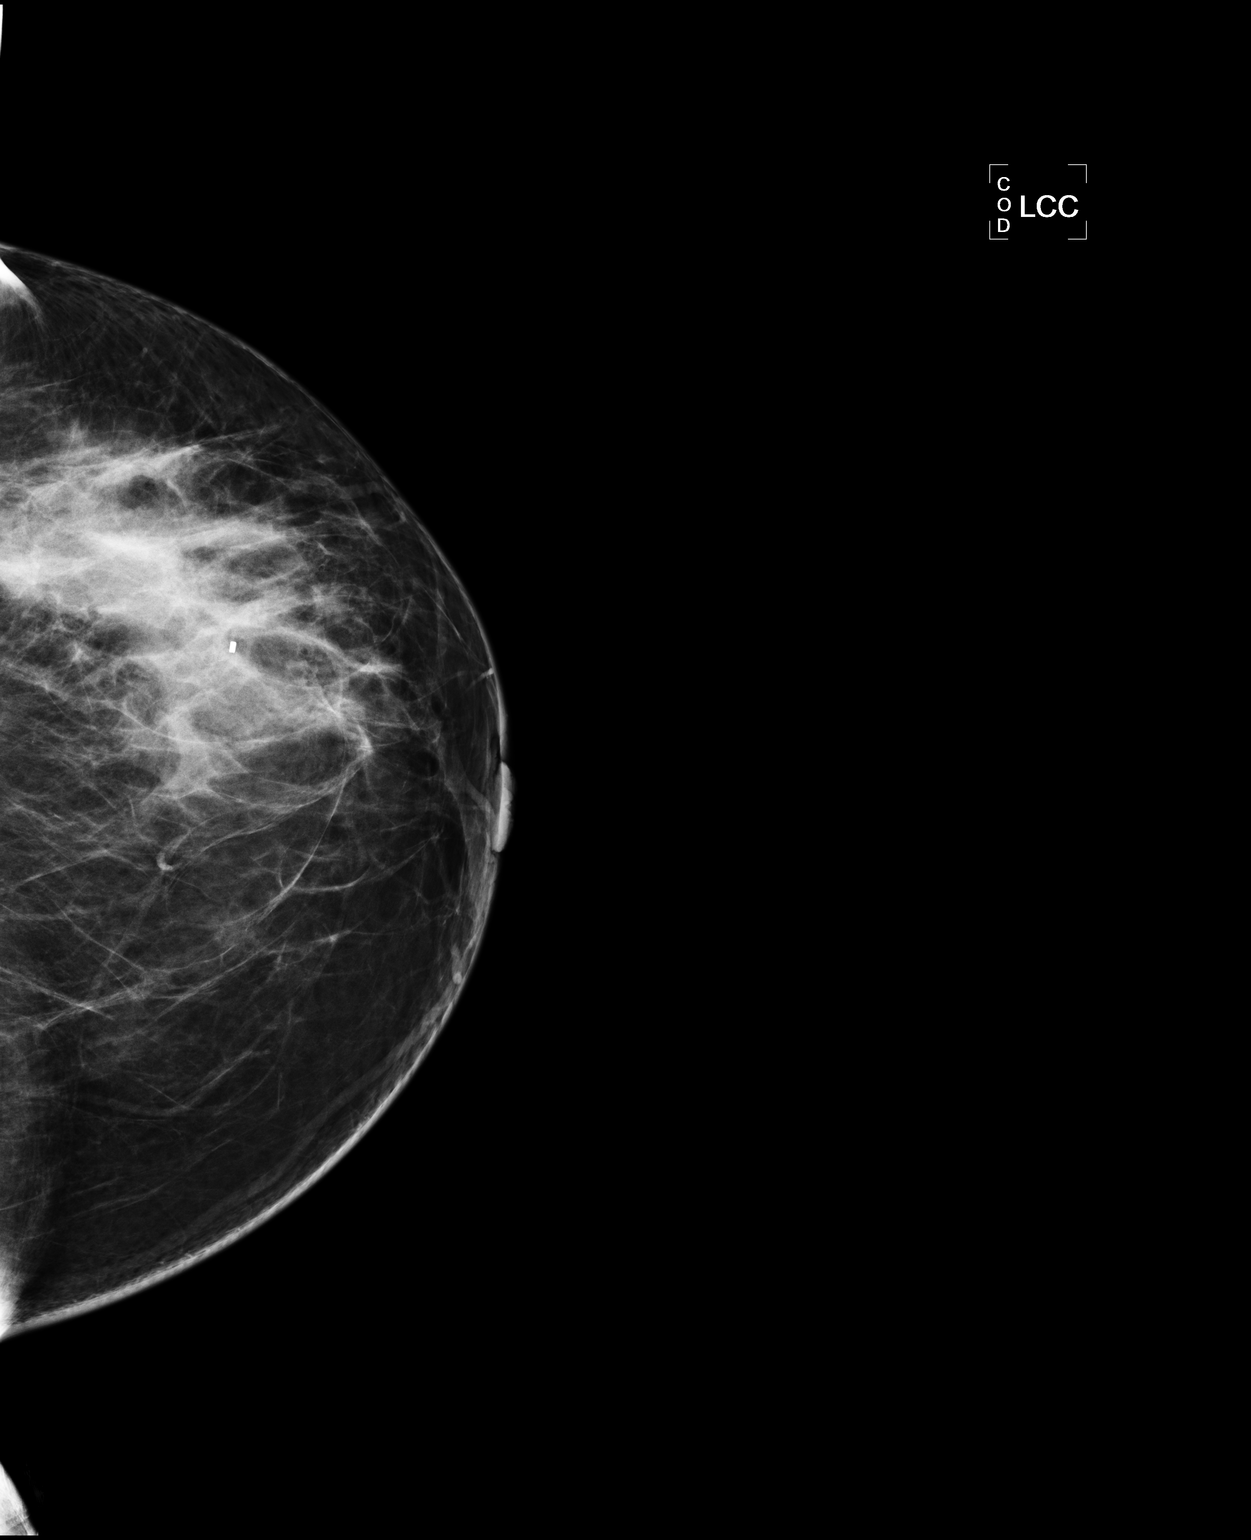

[L ML]
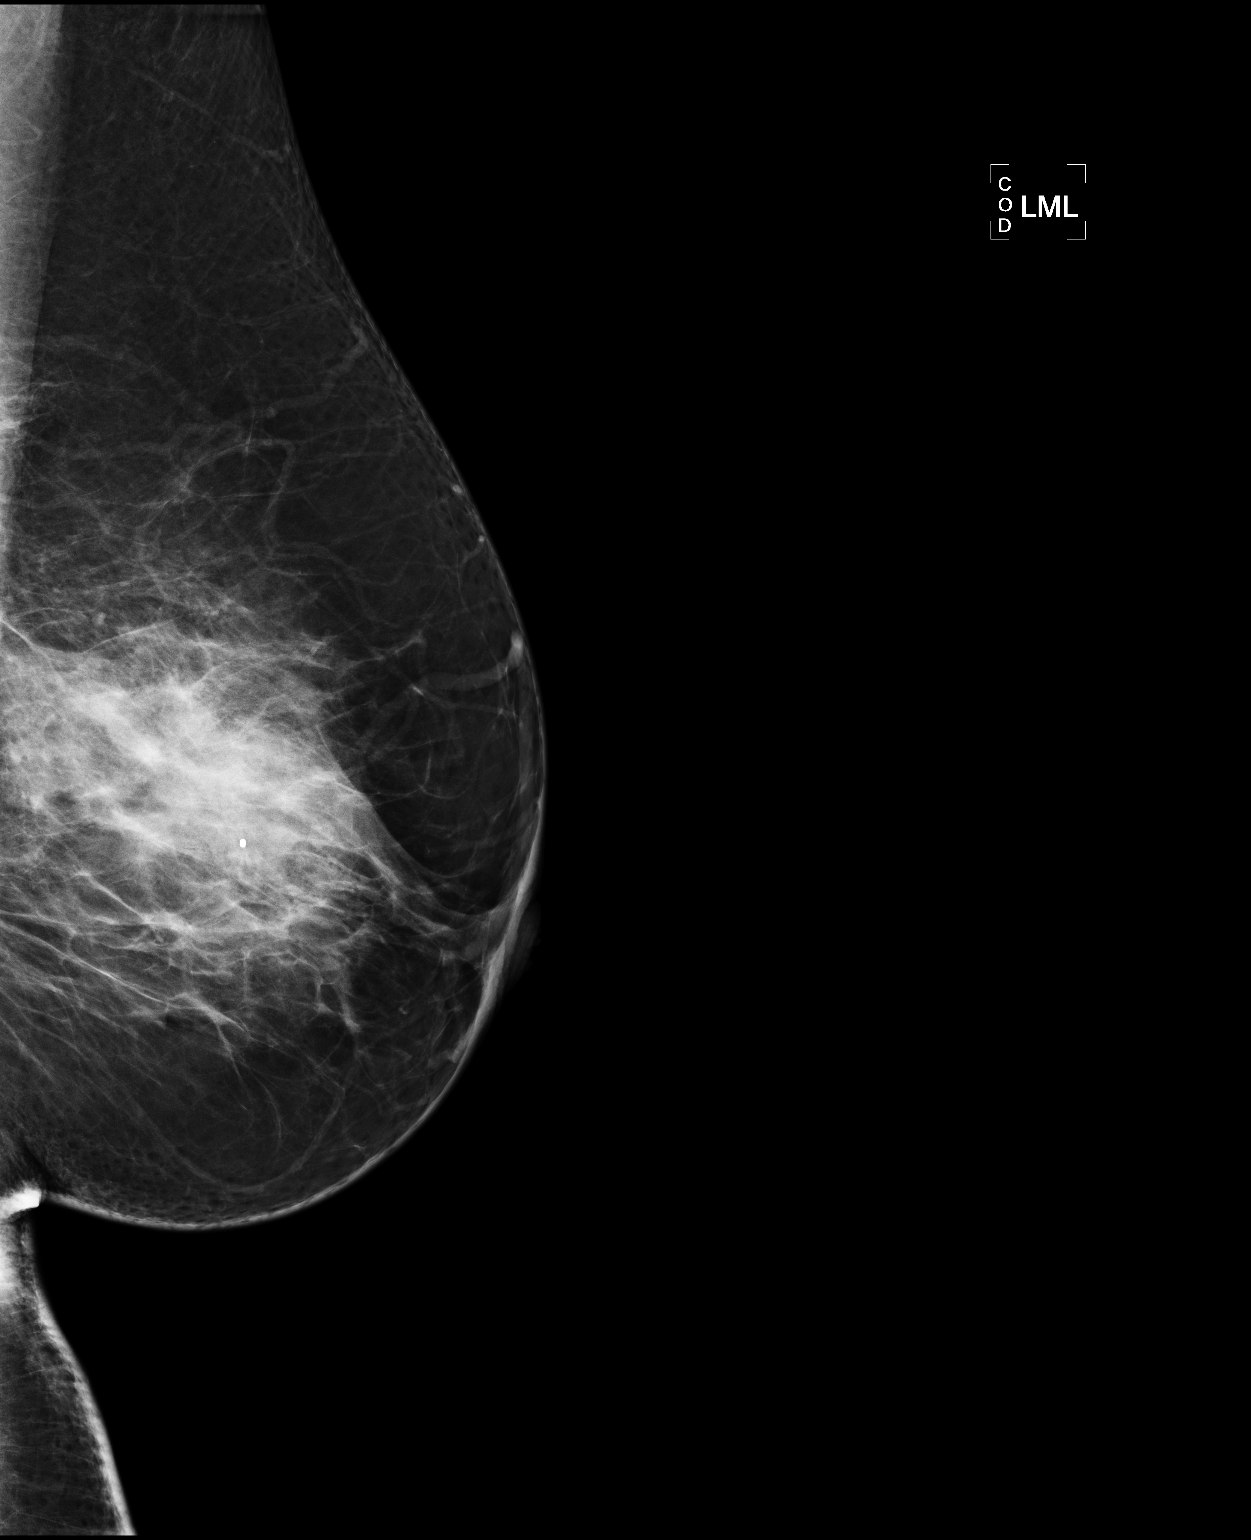

[2 of 2 positions shown; findings below may reference images not displayed]

FINDINGS: Mammographic images were obtained following ultrasound guided biopsy
of the large mass encompassing much of the outer left breast. The
cylinder shaped tissue marker clip is positioned at the anterior
aspect of this mass.
IMPRESSION: Appropriate positioning of the cylinder shaped tissue marker clip in
the anterior aspect of the mass encompassing the outer left breast.

Final Assessment: Post Procedure Mammograms for Marker Placement

## 2014-02-02 ENCOUNTER — Ambulatory Visit (INDEPENDENT_AMBULATORY_CARE_PROVIDER_SITE_OTHER): Payer: Self-pay | Admitting: General Surgery

## 2014-02-02 ENCOUNTER — Encounter (INDEPENDENT_AMBULATORY_CARE_PROVIDER_SITE_OTHER): Payer: Self-pay | Admitting: General Surgery

## 2014-02-02 VITALS — BP 116/82 | HR 80 | Temp 97.5°F | Resp 16 | Ht 64.0 in | Wt 180.8 lb

## 2014-02-02 DIAGNOSIS — N611 Abscess of the breast and nipple: Secondary | ICD-10-CM

## 2014-02-02 DIAGNOSIS — N61 Mastitis without abscess: Secondary | ICD-10-CM

## 2014-02-02 NOTE — Progress Notes (Signed)
HISTORY: Pt is a [age] yo F with a recent history of breast mass and abscess.  She required I&D and had prolonged drainage from the wound.  She has been doing well the last 3 months.  She denies pain or drainage.  The firm area has resolved.    PERTINENT REVIEW OF SYSTEMS: Otherwise negative times 11.    Filed Vitals:   02/02/14 1236  BP: 116/82  Pulse: 80  Temp: 97.5 F (36.4 C)  Resp: 16   Filed Weights   02/02/14 1236  Weight: 180 lb 12.8 oz (82.01 kg)     EXAM: Head: Normocephalic and atraumatic.  Eyes:  Conjunctivae are normal. Pupils are equal, round, and reactive to light. No scleral icterus.  Resp:  No distress, normal effort.\ Breast:  Sl hyperpigmentation at 3 oclock position.  Area no longer firm.  No drainage.  No cellulitis.     Neurological: Alert and oriented to person, place, and time. Coordination normal.  Skin: Skin is warm and dry. No rash noted. No diaphoretic. No erythema. No pallor.  Psychiatric: Normal mood and affect. Normal behavior. Judgment and thought content normal.      ASSESSMENT AND PLAN:   Left breast abscess Resolved.  Follow up as needed.  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  I advised her to call early if she develops recurrent pain.           Maudry DiegoFaera L Kruti Horacek, MD Surgical Oncology, General & Endocrine Surgery [facility]  No PCP Per Patient No ref. provider found

## 2014-02-02 NOTE — Assessment & Plan Note (Signed)
Resolved.  Follow up as needed.  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  I advised her to call early if she develops recurrent pain.

## 2014-02-02 NOTE — Patient Instructions (Signed)
Call if you develop recurrent symptoms.    Follow up PRN.

## 2014-03-23 ENCOUNTER — Ambulatory Visit: Payer: Self-pay

## 2014-07-30 ENCOUNTER — Ambulatory Visit: Payer: Self-pay | Attending: Internal Medicine

## 2014-08-25 ENCOUNTER — Ambulatory Visit: Payer: Self-pay

## 2014-09-06 ENCOUNTER — Telehealth (HOSPITAL_COMMUNITY): Payer: Self-pay | Admitting: *Deleted

## 2014-09-06 NOTE — Telephone Encounter (Signed)
Attempted to call patient to discuss the bills she received that she had left me to review to see if covered by [facility]. No one answered the phone. Left voicemail for patient to call me back.

## 2014-09-13 ENCOUNTER — Encounter (INDEPENDENT_AMBULATORY_CARE_PROVIDER_SITE_OTHER): Payer: Self-pay | Admitting: General Surgery

## 2014-11-17 ENCOUNTER — Ambulatory Visit: Payer: No Typology Code available for payment source | Attending: Internal Medicine

## 2015-01-11 LAB — OB RESULTS CONSOLE ANTIBODY SCREEN: ANTIBODY SCREEN: NEGATIVE

## 2015-01-11 LAB — OB RESULTS CONSOLE GC/CHLAMYDIA
CHLAMYDIA, DNA PROBE: NEGATIVE
Gonorrhea: NEGATIVE

## 2015-01-11 LAB — OB RESULTS CONSOLE HIV ANTIBODY (ROUTINE TESTING): HIV: NONREACTIVE

## 2015-01-11 LAB — OB RESULTS CONSOLE ABO/RH: RH Type: POSITIVE

## 2015-01-11 LAB — OB RESULTS CONSOLE RUBELLA ANTIBODY, IGM: Rubella: IMMUNE

## 2015-01-11 LAB — OB RESULTS CONSOLE HEPATITIS B SURFACE ANTIGEN: Hepatitis B Surface Ag: NEGATIVE

## 2015-01-11 LAB — OB RESULTS CONSOLE RPR: RPR: NONREACTIVE

## 2015-04-13 ENCOUNTER — Inpatient Hospital Stay (HOSPITAL_COMMUNITY)
Admission: AD | Admit: 2015-04-13 | Discharge: 2015-04-14 | Disposition: A | Discharge status: Home / Self Care | Payer: No Typology Code available for payment source | Source: Ambulatory Visit | Attending: Obstetrics & Gynecology | Admitting: Obstetrics & Gynecology

## 2015-04-13 ENCOUNTER — Encounter (HOSPITAL_COMMUNITY): Payer: Self-pay | Admitting: *Deleted

## 2015-04-13 DIAGNOSIS — Z3A23 23 weeks gestation of pregnancy: Secondary | ICD-10-CM | POA: Insufficient documentation

## 2015-04-13 DIAGNOSIS — R509 Fever, unspecified: Secondary | ICD-10-CM | POA: Insufficient documentation

## 2015-04-13 DIAGNOSIS — O9989 Other specified diseases and conditions complicating pregnancy, childbirth and the puerperium: Secondary | ICD-10-CM | POA: Diagnosis not present

## 2015-04-13 HISTORY — DX: Gestational diabetes mellitus in pregnancy, unspecified control: O24.419

## 2015-04-13 LAB — CBC WITH DIFFERENTIAL/PLATELET
Basophils Absolute: 0 10*3/uL (ref 0.0–0.1)
Basophils Relative: 0 % (ref 0–1)
Eosinophils Absolute: 0 10*3/uL (ref 0.0–0.7)
Eosinophils Relative: 0 % (ref 0–5)
HCT: 31.6 % — ABNORMAL LOW (ref 36.0–46.0)
HEMOGLOBIN: 11.2 g/dL — AB (ref 12.0–15.0)
Lymphocytes Relative: 10 % — ABNORMAL LOW (ref 12–46)
Lymphs Abs: 0.7 10*3/uL (ref 0.7–4.0)
MCH: 29.3 pg (ref 26.0–34.0)
MCHC: 35.4 g/dL (ref 30.0–36.0)
MCV: 82.7 fL (ref 78.0–100.0)
MONO ABS: 0.7 10*3/uL (ref 0.1–1.0)
Monocytes Relative: 10 % (ref 3–12)
Neutro Abs: 5.2 10*3/uL (ref 1.7–7.7)
Neutrophils Relative %: 80 % — ABNORMAL HIGH (ref 43–77)
PLATELETS: 164 10*3/uL (ref 150–400)
RBC: 3.82 MIL/uL — ABNORMAL LOW (ref 3.87–5.11)
RDW: 13.1 % (ref 11.5–15.5)
WBC: 6.5 10*3/uL (ref 4.0–10.5)

## 2015-04-13 LAB — RAPID STREP SCREEN (MED CTR MEBANE ONLY): Streptococcus, Group A Screen (Direct): NEGATIVE

## 2015-04-13 MED ORDER — ONDANSETRON 8 MG PO TBDP: 8.0000 mg | ORAL_TABLET | Freq: Three times a day (TID) | ORAL | Status: DC | PRN

## 2015-04-13 MED ORDER — ONDANSETRON HCL 4 MG/2ML IJ SOLN
4.0000 mg | Freq: Once | INTRAMUSCULAR | Status: AC
  Administered 2015-04-13: 4 mg via INTRAVENOUS
  Filled 2015-04-13: qty 2

## 2015-04-13 MED ORDER — LACTATED RINGERS IV SOLN: INTRAVENOUS | Status: DC

## 2015-04-13 MED ORDER — LACTATED RINGERS IV BOLUS (SEPSIS)
500.0000 mL | Freq: Once | INTRAVENOUS | Status: AC
  Administered 2015-04-13: 500 mL via INTRAVENOUS

## 2015-04-13 MED ORDER — ACETAMINOPHEN 500 MG PO TABS
1000.0000 mg | ORAL_TABLET | Freq: Once | ORAL | Status: AC
  Administered 2015-04-13: 1000 mg via ORAL
  Filled 2015-04-13: qty 2

## 2015-04-13 NOTE — MAU Provider Note (Signed)
History    Shannon Dennis is a 39y.o. [id number] at 23.2wks who presents, by doctor referral, for fever and nausea.  Patient states symptoms started about 2 days ago and have resulted in decreased appetite and vomiting. Patient reports temperature as high as 104.0 and states that her children have been diagnosed with strep throat.  Patient reports good fetal movement and denies LOF, VB, and ctx.  Patient further denies issues with urination, constipation, diarrhea, or pain.   Patient Active Problem List   Diagnosis Date Noted  . Left breast abscess 10/06/2013  . Breast lump on left side at 3 o'clock position 08/18/2013    No chief complaint on file.  HPI  OB History    Gravida Para Term Preterm AB TAB SAB Ectopic Multiple Living   Past Medical History  Diagnosis Date  . Gestational diabetes 2012    Past Surgical History  Procedure Laterality Date  . Cesarean section      one previous  . Breast surgery      right    Family History  Problem Relation Age of Onset  . Diabetes Mother   . Hypertension Mother   . Diabetes Father   . Hypertension Father     History  Substance Use Topics  . Smoking status: Never Smoker   . Smokeless tobacco: Never Used  . Alcohol Use: No    Allergies: No Known Allergies  Prescriptions prior to admission  Medication Sig Dispense Refill Last Dose  . acetaminophen (TYLENOL) 325 MG tablet Take 650 mg by mouth every 6 (six) hours as needed for mild pain.    Past Week at Unknown time  . ibuprofen (ADVIL,MOTRIN) 200 MG tablet Take 400 mg by mouth every 6 (six) hours as needed for moderate pain.    04/13/2015 at 0100  . Prenatal Vit-Fe Fumarate-FA (PRENATAL MULTIVITAMIN) TABS tablet Take 1 tablet by mouth daily at 12 noon.   Past Week at Unknown time    ROS  See HPI Above Physical Exam   Blood pressure 133/79, pulse 112, temperature 101 F (38.3 C), temperature source Oral, resp. rate 20, height  (1.6 m), weight 84.142 kg  (185 lb 8 oz), SpO2 100 %.  Results for orders placed or performed during the hospital encounter of 04/13/15 (from the past 24 hour(s))  CBC with Differential/Platelet     Status: Abnormal   Collection Time: 04/13/15  9:55 PM  Result Value Ref Range   WBC 6.5 4.0 - 10.5 K/uL   RBC 3.82 (L) 3.87 - 5.11 MIL/uL   Hemoglobin 11.2 (L) 12.0 - 15.0 g/dL   HCT 16.1 (L) 09.6 - 04.5 %   MCV 82.7 78.0 - 100.0 fL   MCH 29.3 26.0 - 34.0 pg   MCHC 35.4 30.0 - 36.0 g/dL   RDW 40.9 81.1 - 91.4 %   Platelets 164 150 - 400 K/uL   Neutrophils Relative % 80 (H) 43 - 77 %   Neutro Abs 5.2 1.7 - 7.7 K/uL   Lymphocytes Relative 10 (L) 12 - 46 %   Lymphs Abs 0.7 0.7 - 4.0 K/uL   Monocytes Relative 10 3 - 12 %   Monocytes Absolute 0.7 0.1 - 1.0 K/uL   Eosinophils Relative 0 0 - 5 %   Eosinophils Absolute 0.0 0.0 - 0.7 K/uL   Basophils Relative 0 0 - 1 %   Basophils Absolute 0.0 0.0 -  0.1 K/uL  Rapid strep screen     Status: None   Collection Time: 04/13/15 10:13 PM  Result Value Ref Range   Streptococcus, Group A Screen (Direct) NEGATIVE NEGATIVE    Physical Exam  Constitutional: She is oriented to person, place, and time. She appears well-developed and well-nourished.  HENT:  Hijab on   Eyes: EOM are normal.  Neck: Normal range of motion.  Cardiovascular: Regular rhythm and normal heart sounds.  Tachycardia present.   Respiratory: Effort normal and breath sounds normal.  GI: Soft. Bowel sounds are normal.  Musculoskeletal: Normal range of motion.  Neurological: She is alert and oriented to person, place, and time.  Skin: Skin is warm. She is diaphoretic.     FHR:175 bpm, Mod Var, + Occ Variable Decels, +Accels UC: None ED Course  Assessment: IUP at 23.2wks Reassuring FHT Maternal Fever  Plan: -PE as above -Labs: CBC w/diff, Strep, and Influenza -IV bolus and continuous infusion -Zofran IV  Follow Up (2330) -Patient reports nausea has resolved with zofran -Rx sent to pharmacy for  zofran 8 mg q 8hrs prn, Disp 20 rf 0 -Informed of lab results and pending labs; will call with positive influenza -Given non-pharmacologic treatment methods including hydration, small meals, and rest -Instructed to refer to NOB booklet for medications safe during pregnancy -Keep appt as scheduled:  04/21/2015 -Encouraged to call if any questions or concerns arise prior to next scheduled office visit.  -Discharged to home in improved condition  Braydin Aloi LYNN CNM, MSN 04/13/2015 10:13 PM

## 2015-04-13 NOTE — MAU Note (Signed)
Pt reports cough, pain in her chest when she coughs, sore throat and fever for 24 hours.

## 2015-04-13 NOTE — Discharge Instructions (Signed)

## 2015-04-13 NOTE — MAU Note (Signed)
Pt. Urine in lab 

## 2015-04-14 DIAGNOSIS — O9989 Other specified diseases and conditions complicating pregnancy, childbirth and the puerperium: Secondary | ICD-10-CM | POA: Diagnosis not present

## 2015-04-14 LAB — INFLUENZA PANEL BY PCR (TYPE A & B)
H1N1 flu by pcr: NOT DETECTED
Influenza A By PCR: NEGATIVE
Influenza B By PCR: POSITIVE — AB

## 2015-04-16 LAB — CULTURE, GROUP A STREP: Strep A Culture: NEGATIVE

## 2015-06-22 ENCOUNTER — Encounter: Payer: Medicaid Other | Attending: Obstetrics and Gynecology

## 2015-06-22 ENCOUNTER — Other Ambulatory Visit: Payer: Self-pay | Admitting: Obstetrics & Gynecology

## 2015-06-22 VITALS — Ht 62.0 in | Wt 189.0 lb

## 2015-06-22 DIAGNOSIS — Z713 Dietary counseling and surveillance: Secondary | ICD-10-CM | POA: Diagnosis not present

## 2015-06-22 DIAGNOSIS — O24419 Gestational diabetes mellitus in pregnancy, unspecified control: Secondary | ICD-10-CM | POA: Diagnosis present

## 2015-06-23 NOTE — Progress Notes (Signed)
  Patient was seen on 06/22/15 for Gestational Diabetes self-management . The following learning objectives were met by the patient :   States the definition of Gestational Diabetes  States why dietary management is important in controlling blood glucose  Describes the effects of carbohydrates on blood glucose levels  Demonstrates ability to create a balanced meal plan  Demonstrates carbohydrate counting   States when to check blood glucose levels  Demonstrates proper blood glucose monitoring techniques  States the effect of stress and exercise on blood glucose levels  States the importance of limiting caffeine and abstaining from alcohol and smoking  Plan:  Aim for 2 Carb Choices per meal (30 grams) +/- 1 either way for breakfast Aim for 3 Carb Choices per meal (45 grams) +/- 1 either way from lunch and dinner Aim for 1-2 Carbs per snack Begin reading food labels for Total Carbohydrate and sugar grams of foods Consider  increasing your activity level by walking daily as tolerated Begin checking BG before breakfast and 2 hours after first bit of breakfast, lunch and dinner after  as directed by MD  Take medication  as directed by MD  Blood glucose monitor given: Accu [employer] connect Lot # [id number] Exp: 04/11/16 Blood glucose reading: 247m/dl,  2hpp dinner reading 110, FBS 06/13/15 924mdl  Patient instructed to monitor glucose levels: FBS: 60 - <90 2 hour: <120  Patient received the following handouts:  Nutrition Diabetes and Pregnancy  Carbohydrate Counting List  Meal Planning worksheet  Patient will be seen for follow-up as needed.

## 2015-07-28 DIAGNOSIS — Z98891 History of uterine scar from previous surgery: Secondary | ICD-10-CM

## 2015-07-28 DIAGNOSIS — O09529 Supervision of elderly multigravida, unspecified trimester: Secondary | ICD-10-CM

## 2015-07-28 DIAGNOSIS — N979 Female infertility, unspecified: Secondary | ICD-10-CM | POA: Diagnosis not present

## 2015-07-28 DIAGNOSIS — O24419 Gestational diabetes mellitus in pregnancy, unspecified control: Secondary | ICD-10-CM | POA: Diagnosis present

## 2015-07-28 NOTE — H&P (Signed)
Shannon Dennis is a 39 y.o. female, G3P2002 at 23 weeks, presenting for scheduled repeat C/S.  Denies leaking or bleeding, reports +FM.  Reports CBGs have been WNL.  Patient Active Problem List   Diagnosis Date Noted  . Gestational diabetes--diet controlled 07/28/2015  . Previous cesarean section x 2 07/28/2015  . AMA (advanced maternal age) multigravida 35+ 07/28/2015  . 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   EDC of 08/08/15 was established by LMP and in agreement with [place] at 6 weeks.   Anatomy scan:  20 4/7 weeks, with limited anatomy and an posterior placenta.   Additional [place] evaluations:     24 3/7 weeks:  EFW 1+7, 24%ile, cervix 3.74, completion of anatomy. 38 3/7 weeks:  EFW 7+15, 76%ile, normal fluid, vtx. Significant prenatal events:  Struggled with nausea in early pregnancy, given Phenergan.  Became ill at 23 2/7 weeks, dx with influenza, seen in MAU and treated.  Declined BTL, undecided about contraception.  Had elevated 1 hour GTT, with 3 abnormal values on 3 hour GTT.  Maintained dietary control throughout pregnancy.  Started on Fe during pregnancy for Hgb 10.3 at 28 weeks.  Elected to proceed with repeat C/S. Last evaluation:  07/28/15--BP 116/76, [place] with normal growth and fluid.  OB History    Gravida Para Term Preterm AB TAB SAB Ectopic Multiple Living   3 2 2       2     2009--Primary LTCS, 40 weeks, 8 hour labor, 7 lbs, female, delivered in [place], likely FTP, spinal 2012--Repeat LTCS, 40 weeks, scheduled repeat, 7 labs, female, spinal, delivered in [place], [place], gestational diabetes.  Past Medical History  Diagnosis Date  . Gestational diabetes 2012   Past Surgical History  Procedure Laterality Date  . Cesarean section      one previous  . Breast surgery      right  Left breast cyst and abscess 2015  Family History: family history includes Diabetes in her father and mother;  Hypertension in her father and mother.   Social History:  reports that she has never smoked. She has never used smokeless tobacco. She reports that she does not drink alcohol or use illicit drugs.  Patient is from the [place], of the Muslim faith, married to  [employer], who is involved and supportive.  Patient is a current Archivist.    Prenatal Transfer Tool  Maternal Diabetes: Yes:  Diabetes Type:  Diet controlled Genetic Screening: Normal 1st trimester screen Maternal Ultrasounds/Referrals: Normal Fetal Ultrasounds or other Referrals:  None Maternal Substance Abuse:  No Significant Maternal Medications:  None Significant Maternal Lab Results: Lab values include: Group B Strep negative  TDAP NA Flu NA  ROS:  +FM, occasional UCs.  No Known Allergies     Blood pressure 131/82, pulse 86, temperature 98.2 F (36.8 C), temperature source Oral, resp. rate 18, SpO2 100 %.  Chest clear Heart RRR without murmur Abd gravid, NT, FH 38 cm Pelvic: Deferred Ext: WNL  FHR: 145 per doppler UCs:  Occasional per patient.  Prenatal labs: ABO, Rh: --/--/B POS, B POS (09/19 0845)B+ Antibody: NEG (09/19 0845)Neg Rubella:   Immune RPR: Non Reactive (09/19 0845) NR HBsAg: Negative (03/01 0000) Neg HIV: Non-reactive (03/01 0000) NR GBS:  Negative 07/12/15 Sickle cell/Hgb electrophoresis:  AA Pap:  Declined GC:  Negative 12/29/14 Chlamydia:  Negative 12/29/14 Genetic screenings:  Normal 1st trimester screen Glucola:  Elevated,  with abnormal 3 hour GTT (3/4 abnormal values) Other:   Hgb 13.6 at NOB, 10.3 at 28 weeks Positive influenza B testing 04/23/15   Assessment/Plan: IUP at 39 weeks Previous C/S x 2, desires repeat Gestational diabetes AMA Mild anemia GBS negative  Plan: Ad[facility] to [facility] per consult with Dr. Sallye Ober for repeat C/S. Routine [facility] pre-op orders CBG monitoring per Dr. Irineo Axon orders.  Reonna Finlayson, VICKICNM, [place] 08/02/2015, 1:23 PM

## 2015-08-01 ENCOUNTER — Encounter (HOSPITAL_COMMUNITY)
Admission: RE | Admit: 2015-08-01 | Discharge: 2015-08-01 | Disposition: A | Discharge status: Home / Self Care | Payer: Medicaid Other | Source: Ambulatory Visit | Attending: Obstetrics & Gynecology | Admitting: Obstetrics & Gynecology

## 2015-08-01 ENCOUNTER — Encounter (HOSPITAL_COMMUNITY): Payer: Self-pay

## 2015-08-01 LAB — TYPE AND SCREEN
ABO/RH(D): B POS
Antibody Screen: NEGATIVE

## 2015-08-01 LAB — CBC
HCT: 30 % — ABNORMAL LOW (ref 36.0–46.0)
Hemoglobin: 10 g/dL — ABNORMAL LOW (ref 12.0–15.0)
MCH: 25.8 pg — ABNORMAL LOW (ref 26.0–34.0)
MCHC: 33.3 g/dL (ref 30.0–36.0)
MCV: 77.3 fL — ABNORMAL LOW (ref 78.0–100.0)
PLATELETS: 178 10*3/uL (ref 150–400)
RBC: 3.88 MIL/uL (ref 3.87–5.11)
RDW: 14.4 % (ref 11.5–15.5)
WBC: 6.2 10*3/uL (ref 4.0–10.5)

## 2015-08-01 LAB — ABO/RH: ABO/RH(D): B POS

## 2015-08-01 NOTE — Patient Instructions (Signed)
Your procedure is scheduled on:  August 02, 2015  Enter through the Main Entrance of [facility] at:  1:00 pm    Pick up the phone at the desk and dial [phone].  Call this number if you have problems the morning of surgery: [phone].  Remember: Do NOT eat food:  After midnight tonight  Do NOT drink clear liquids after: 10:30 am day of surgery  Take these medicines the morning of surgery with a SIP OF WATER: none   Do NOT wear jewelry (body piercing), metal hair clips/bobby pins, or nail polish. Do NOT wear lotions, powders, or perfumes.  You may wear deoderant. Do NOT shave for 48 hours prior to surgery. Do NOT bring valuables to the hospital. Leave suitcase in car.  After surgery it may be brought to your room.  For patients admitted to the hospital, checkout time is 11:00 AM the day of discharge.

## 2015-08-02 ENCOUNTER — Encounter (HOSPITAL_COMMUNITY): Payer: Self-pay

## 2015-08-02 ENCOUNTER — Inpatient Hospital Stay (HOSPITAL_COMMUNITY)
Admission: RE | Admit: 2015-08-02 | Discharge: 2015-08-05 | DRG: 766 | Disposition: A | Discharge status: Home / Self Care | Payer: Medicaid Other | Source: Ambulatory Visit | Attending: Obstetrics & Gynecology | Admitting: Obstetrics & Gynecology

## 2015-08-02 ENCOUNTER — Encounter (HOSPITAL_COMMUNITY)
Admission: RE | Disposition: A | Discharge status: Home / Self Care | Payer: Self-pay | Source: Ambulatory Visit | Attending: Obstetrics & Gynecology

## 2015-08-02 ENCOUNTER — Inpatient Hospital Stay (HOSPITAL_COMMUNITY): Payer: Medicaid Other | Admitting: Anesthesiology

## 2015-08-02 DIAGNOSIS — N979 Female infertility, unspecified: Secondary | ICD-10-CM | POA: Diagnosis not present

## 2015-08-02 DIAGNOSIS — E669 Obesity, unspecified: Secondary | ICD-10-CM | POA: Diagnosis present

## 2015-08-02 DIAGNOSIS — O99214 Obesity complicating childbirth: Secondary | ICD-10-CM | POA: Diagnosis present

## 2015-08-02 DIAGNOSIS — K660 Peritoneal adhesions (postprocedural) (postinfection): Secondary | ICD-10-CM | POA: Diagnosis present

## 2015-08-02 DIAGNOSIS — O09523 Supervision of elderly multigravida, third trimester: Secondary | ICD-10-CM

## 2015-08-02 DIAGNOSIS — O3421 Maternal care for scar from previous cesarean delivery: Principal | ICD-10-CM | POA: Diagnosis present

## 2015-08-02 DIAGNOSIS — Z98891 History of uterine scar from previous surgery: Secondary | ICD-10-CM

## 2015-08-02 DIAGNOSIS — Z8249 Family history of ischemic heart disease and other diseases of the circulatory system: Secondary | ICD-10-CM | POA: Diagnosis not present

## 2015-08-02 DIAGNOSIS — O24419 Gestational diabetes mellitus in pregnancy, unspecified control: Secondary | ICD-10-CM | POA: Diagnosis present

## 2015-08-02 DIAGNOSIS — Z6834 Body mass index (BMI) 34.0-34.9, adult: Secondary | ICD-10-CM | POA: Diagnosis not present

## 2015-08-02 DIAGNOSIS — Z3A39 39 weeks gestation of pregnancy: Secondary | ICD-10-CM | POA: Diagnosis present

## 2015-08-02 DIAGNOSIS — O9902 Anemia complicating childbirth: Secondary | ICD-10-CM | POA: Diagnosis present

## 2015-08-02 DIAGNOSIS — O09529 Supervision of elderly multigravida, unspecified trimester: Secondary | ICD-10-CM

## 2015-08-02 DIAGNOSIS — Z833 Family history of diabetes mellitus: Secondary | ICD-10-CM | POA: Diagnosis not present

## 2015-08-02 LAB — RPR: RPR: NONREACTIVE

## 2015-08-02 LAB — GLUCOSE, CAPILLARY
GLUCOSE-CAPILLARY: 89 mg/dL (ref 65–99)
Glucose-Capillary: 75 mg/dL (ref 65–99)

## 2015-08-02 SURGERY — Surgical Case
Anesthesia: Spinal | Site: Abdomen

## 2015-08-02 MED ORDER — CEFAZOLIN SODIUM-DEXTROSE 2-3 GM-% IV SOLR
INTRAVENOUS | Status: AC
  Filled 2015-08-02: qty 50

## 2015-08-02 MED ORDER — SIMETHICONE 80 MG PO CHEW
80.0000 mg | CHEWABLE_TABLET | ORAL | Status: DC
  Administered 2015-08-03 – 2015-08-04 (×3): 80 mg via ORAL
  Filled 2015-08-02 (×3): qty 1

## 2015-08-02 MED ORDER — PROMETHAZINE HCL 25 MG/ML IJ SOLN: 6.2500 mg | INTRAMUSCULAR | Status: DC | PRN

## 2015-08-02 MED ORDER — OXYCODONE-ACETAMINOPHEN 5-325 MG PO TABS
2.0000 | ORAL_TABLET | ORAL | Status: DC | PRN
  Administered 2015-08-05 (×2): 2 via ORAL
  Filled 2015-08-02 (×2): qty 2

## 2015-08-02 MED ORDER — ONDANSETRON HCL 4 MG/2ML IJ SOLN
INTRAMUSCULAR | Status: AC
  Filled 2015-08-02: qty 2

## 2015-08-02 MED ORDER — SIMETHICONE 80 MG PO CHEW
80.0000 mg | CHEWABLE_TABLET | Freq: Three times a day (TID) | ORAL | Status: DC
  Administered 2015-08-03 – 2015-08-05 (×7): 80 mg via ORAL
  Filled 2015-08-02 (×7): qty 1

## 2015-08-02 MED ORDER — DIPHENHYDRAMINE HCL 25 MG PO CAPS
25.0000 mg | ORAL_CAPSULE | ORAL | Status: DC | PRN
  Administered 2015-08-03: 25 mg via ORAL

## 2015-08-02 MED ORDER — SENNOSIDES-DOCUSATE SODIUM 8.6-50 MG PO TABS
2.0000 | ORAL_TABLET | ORAL | Status: DC
  Administered 2015-08-03 – 2015-08-04 (×3): 2 via ORAL
  Filled 2015-08-02 (×3): qty 2

## 2015-08-02 MED ORDER — KETOROLAC TROMETHAMINE 30 MG/ML IJ SOLN: 30.0000 mg | Freq: Four times a day (QID) | INTRAMUSCULAR | Status: AC | PRN

## 2015-08-02 MED ORDER — INFLUENZA VAC SPLIT QUAD 0.5 ML IM SUSY
0.5000 mL | PREFILLED_SYRINGE | INTRAMUSCULAR | Status: AC
  Administered 2015-08-05: 0.5 mL via INTRAMUSCULAR
  Filled 2015-08-02: qty 0.5

## 2015-08-02 MED ORDER — NALBUPHINE HCL 10 MG/ML IJ SOLN
INTRAMUSCULAR | Status: AC
  Filled 2015-08-02: qty 1

## 2015-08-02 MED ORDER — SIMETHICONE 80 MG PO CHEW
80.0000 mg | CHEWABLE_TABLET | ORAL | Status: DC | PRN
Start: 2015-08-02 — End: 2015-08-05

## 2015-08-02 MED ORDER — SCOPOLAMINE 1 MG/3DAYS TD PT72
1.0000 | MEDICATED_PATCH | Freq: Once | TRANSDERMAL | Status: AC
  Administered 2015-08-02: 1.5 mg via TRANSDERMAL

## 2015-08-02 MED ORDER — ACETAMINOPHEN 500 MG PO TABS
1000.0000 mg | ORAL_TABLET | Freq: Four times a day (QID) | ORAL | Status: AC
  Administered 2015-08-02 – 2015-08-03 (×3): 1000 mg via ORAL
  Filled 2015-08-02 (×3): qty 2

## 2015-08-02 MED ORDER — DIBUCAINE 1 % RE OINT: 1.0000 "application " | TOPICAL_OINTMENT | RECTAL | Status: DC | PRN

## 2015-08-02 MED ORDER — NALOXONE HCL 1 MG/ML IJ SOLN: 1.0000 ug/kg/h | INTRAMUSCULAR | Status: DC | PRN

## 2015-08-02 MED ORDER — SODIUM CHLORIDE 0.9 % IJ SOLN: 3.0000 mL | INTRAMUSCULAR | Status: DC | PRN

## 2015-08-02 MED ORDER — NALBUPHINE HCL 10 MG/ML IJ SOLN
5.0000 mg | INTRAMUSCULAR | Status: DC | PRN
  Administered 2015-08-02: 5 mg via INTRAVENOUS
  Filled 2015-08-02 (×3): qty 0.5

## 2015-08-02 MED ORDER — 0.9 % SODIUM CHLORIDE (POUR BTL) OPTIME
TOPICAL | Status: DC | PRN
  Administered 2015-08-02: 1000 mL

## 2015-08-02 MED ORDER — MENTHOL 3 MG MT LOZG: 1.0000 | LOZENGE | OROMUCOSAL | Status: DC | PRN

## 2015-08-02 MED ORDER — KETOROLAC TROMETHAMINE 30 MG/ML IJ SOLN
30.0000 mg | Freq: Four times a day (QID) | INTRAMUSCULAR | Status: AC | PRN
  Administered 2015-08-02: 30 mg via INTRAMUSCULAR

## 2015-08-02 MED ORDER — FENTANYL CITRATE (PF) 100 MCG/2ML IJ SOLN
INTRAMUSCULAR | Status: DC | PRN
  Administered 2015-08-02: 10 ug via INTRATHECAL

## 2015-08-02 MED ORDER — ONDANSETRON HCL 4 MG/2ML IJ SOLN
INTRAMUSCULAR | Status: DC | PRN
  Administered 2015-08-02: 4 mg via INTRAVENOUS

## 2015-08-02 MED ORDER — FENTANYL CITRATE (PF) 100 MCG/2ML IJ SOLN
INTRAMUSCULAR | Status: AC
  Filled 2015-08-02: qty 4

## 2015-08-02 MED ORDER — FENTANYL CITRATE (PF) 100 MCG/2ML IJ SOLN: 25.0000 ug | INTRAMUSCULAR | Status: DC | PRN

## 2015-08-02 MED ORDER — CITRIC ACID-SODIUM CITRATE 334-500 MG/5ML PO SOLN
ORAL | Status: AC
  Administered 2015-08-02: 30 mL via ORAL
  Filled 2015-08-02: qty 15

## 2015-08-02 MED ORDER — LACTATED RINGERS IV SOLN
INTRAVENOUS | Status: DC
Start: 2015-08-02 — End: 2015-08-05
  Administered 2015-08-03: 04:00:00 via INTRAVENOUS

## 2015-08-02 MED ORDER — OXYTOCIN 10 UNIT/ML IJ SOLN
INTRAMUSCULAR | Status: AC
  Filled 2015-08-02: qty 4

## 2015-08-02 MED ORDER — LANOLIN HYDROUS EX OINT: 1.0000 "application " | TOPICAL_OINTMENT | CUTANEOUS | Status: DC | PRN

## 2015-08-02 MED ORDER — ACETAMINOPHEN 325 MG PO TABS: 650.0000 mg | ORAL_TABLET | ORAL | Status: DC | PRN

## 2015-08-02 MED ORDER — NALOXONE HCL 0.4 MG/ML IJ SOLN: 0.4000 mg | INTRAMUSCULAR | Status: DC | PRN

## 2015-08-02 MED ORDER — OXYCODONE-ACETAMINOPHEN 5-325 MG PO TABS
1.0000 | ORAL_TABLET | ORAL | Status: DC | PRN
  Administered 2015-08-03 – 2015-08-04 (×5): 1 via ORAL
  Filled 2015-08-02 (×5): qty 1

## 2015-08-02 MED ORDER — PRENATAL MULTIVITAMIN CH
1.0000 | ORAL_TABLET | Freq: Every day | ORAL | Status: DC
  Administered 2015-08-03 – 2015-08-05 (×3): 1 via ORAL
  Filled 2015-08-02 (×3): qty 1

## 2015-08-02 MED ORDER — DIPHENHYDRAMINE HCL 25 MG PO CAPS
25.0000 mg | ORAL_CAPSULE | Freq: Four times a day (QID) | ORAL | Status: DC | PRN
  Filled 2015-08-02: qty 1

## 2015-08-02 MED ORDER — IBUPROFEN 600 MG PO TABS
600.0000 mg | ORAL_TABLET | Freq: Four times a day (QID) | ORAL | Status: DC
  Administered 2015-08-03 – 2015-08-05 (×11): 600 mg via ORAL
  Filled 2015-08-02 (×11): qty 1

## 2015-08-02 MED ORDER — CEFAZOLIN SODIUM-DEXTROSE 2-3 GM-% IV SOLR
2.0000 g | INTRAVENOUS | Status: AC
  Administered 2015-08-02: 2 g via INTRAVENOUS

## 2015-08-02 MED ORDER — OXYTOCIN 40 UNITS IN LACTATED RINGERS INFUSION - SIMPLE MED: 62.5000 mL/h | INTRAVENOUS | Status: AC

## 2015-08-02 MED ORDER — MORPHINE SULFATE (PF) 0.5 MG/ML IJ SOLN
INTRAMUSCULAR | Status: DC | PRN
  Administered 2015-08-02: .2 mg via INTRATHECAL

## 2015-08-02 MED ORDER — MEPERIDINE HCL 25 MG/ML IJ SOLN: 6.2500 mg | INTRAMUSCULAR | Status: DC | PRN

## 2015-08-02 MED ORDER — NALBUPHINE HCL 10 MG/ML IJ SOLN: 5.0000 mg | Freq: Once | INTRAMUSCULAR | Status: DC | PRN

## 2015-08-02 MED ORDER — DIPHENHYDRAMINE HCL 50 MG/ML IJ SOLN: 12.5000 mg | INTRAMUSCULAR | Status: DC | PRN

## 2015-08-02 MED ORDER — WITCH HAZEL-GLYCERIN EX PADS: 1.0000 "application " | MEDICATED_PAD | CUTANEOUS | Status: DC | PRN

## 2015-08-02 MED ORDER — ZOLPIDEM TARTRATE 5 MG PO TABS: 5.0000 mg | ORAL_TABLET | Freq: Every evening | ORAL | Status: DC | PRN

## 2015-08-02 MED ORDER — TETANUS-DIPHTH-ACELL PERTUSSIS 5-2.5-18.5 LF-MCG/0.5 IM SUSP
0.5000 mL | Freq: Once | INTRAMUSCULAR | Status: AC
  Administered 2015-08-05: 0.5 mL via INTRAMUSCULAR
  Filled 2015-08-02: qty 0.5

## 2015-08-02 MED ORDER — LACTATED RINGERS IV SOLN
Freq: Once | INTRAVENOUS | Status: AC
  Administered 2015-08-02 (×3): via INTRAVENOUS

## 2015-08-02 MED ORDER — FENTANYL CITRATE (PF) 100 MCG/2ML IJ SOLN
INTRAMUSCULAR | Status: AC
  Administered 2015-08-02: 100 ug
  Filled 2015-08-02: qty 2

## 2015-08-02 MED ORDER — PHENYLEPHRINE 8 MG IN D5W 100 ML (0.08MG/ML) PREMIX OPTIME
INJECTION | INTRAVENOUS | Status: AC
  Filled 2015-08-02: qty 100

## 2015-08-02 MED ORDER — BUPIVACAINE IN DEXTROSE 0.75-8.25 % IT SOLN
INTRATHECAL | Status: DC | PRN
  Administered 2015-08-02: 1.6 mL via INTRATHECAL

## 2015-08-02 MED ORDER — PHENYLEPHRINE 8 MG IN D5W 100 ML (0.08MG/ML) PREMIX OPTIME
INJECTION | INTRAVENOUS | Status: DC | PRN
  Administered 2015-08-02: 60 ug/min via INTRAVENOUS

## 2015-08-02 MED ORDER — CITRIC ACID-SODIUM CITRATE 334-500 MG/5ML PO SOLN
30.0000 mL | Freq: Once | ORAL | Status: AC
  Administered 2015-08-02: 30 mL via ORAL

## 2015-08-02 MED ORDER — SCOPOLAMINE 1 MG/3DAYS TD PT72
MEDICATED_PATCH | TRANSDERMAL | Status: AC
  Administered 2015-08-02: 1.5 mg via TRANSDERMAL
  Filled 2015-08-02: qty 1

## 2015-08-02 MED ORDER — MORPHINE SULFATE (PF) 0.5 MG/ML IJ SOLN
INTRAMUSCULAR | Status: AC
  Filled 2015-08-02: qty 100

## 2015-08-02 MED ORDER — OXYTOCIN 10 UNIT/ML IJ SOLN
40.0000 [IU] | INTRAMUSCULAR | Status: DC | PRN
  Administered 2015-08-02: 40 [IU] via INTRAVENOUS

## 2015-08-02 MED ORDER — NALBUPHINE HCL 10 MG/ML IJ SOLN
5.0000 mg | INTRAMUSCULAR | Status: DC | PRN
  Administered 2015-08-02: 5 mg via SUBCUTANEOUS
  Filled 2015-08-02: qty 0.5

## 2015-08-02 MED ORDER — ONDANSETRON HCL 4 MG/2ML IJ SOLN: 4.0000 mg | Freq: Three times a day (TID) | INTRAMUSCULAR | Status: DC | PRN

## 2015-08-02 MED ORDER — KETOROLAC TROMETHAMINE 30 MG/ML IJ SOLN
INTRAMUSCULAR | Status: AC
  Filled 2015-08-02: qty 1

## 2015-08-02 SURGICAL SUPPLY — 32 items
CLAMP CORD UMBIL (MISCELLANEOUS) ×3 IMPLANT
CLOTH BEACON ORANGE TIMEOUT ST (SAFETY) ×3 IMPLANT
DRAPE C SECTION CLR SCREEN (DRAPES) ×3 IMPLANT
DRAPE SHEET LG 3/4 BI-LAMINATE (DRAPES) ×3 IMPLANT
DRSG OPSITE POSTOP 4X10 (GAUZE/BANDAGES/DRESSINGS) ×3 IMPLANT
DURAPREP 26ML APPLICATOR (WOUND CARE) ×3 IMPLANT
ELECT REM PT RETURN 9FT ADLT (ELECTROSURGICAL) ×3
ELECTRODE REM PT RTRN 9FT ADLT (ELECTROSURGICAL) ×1 IMPLANT
EXTRACTOR VACUUM M CUP 4 TUBE (SUCTIONS) ×2 IMPLANT
EXTRACTOR VACUUM M CUP 4' TUBE (SUCTIONS) ×1
GLOVE BIOGEL PI IND STRL 7.0 (GLOVE) ×1 IMPLANT
GLOVE BIOGEL PI INDICATOR 7.0 (GLOVE) ×2
GLOVE SURG SS PI 6.5 STRL IVOR (GLOVE) ×3 IMPLANT
GOWN STRL REUS W/TWL LRG LVL3 (GOWN DISPOSABLE) ×6 IMPLANT
HEMOSTAT SURGICEL 4X8 (HEMOSTASIS) ×3 IMPLANT
LIQUID BAND (GAUZE/BANDAGES/DRESSINGS) ×3 IMPLANT
NS IRRIG 1000ML POUR BTL (IV SOLUTION) ×3 IMPLANT
PACK C SECTION WH (CUSTOM PROCEDURE TRAY) ×3 IMPLANT
PAD OB MATERNITY 4.3X12.25 (PERSONAL CARE ITEMS) ×3 IMPLANT
PENCIL SMOKE EVAC W/HOLSTER (ELECTROSURGICAL) ×3 IMPLANT
RTRCTR C-SECT PINK 25CM LRG (MISCELLANEOUS) ×3 IMPLANT
SUT CHROMIC 2 0 CT 1 (SUTURE) ×3 IMPLANT
SUT MON AB 4-0 PS1 27 (SUTURE) ×3 IMPLANT
SUT PLAIN 2 0 XLH (SUTURE) ×3 IMPLANT
SUT VIC AB 0 CTX 36 (SUTURE) ×2
SUT VIC AB 0 CTX36XBRD ANBCTRL (SUTURE) ×1 IMPLANT
SUT VIC AB 1 CTX 36 (SUTURE) ×4
SUT VIC AB 1 CTX36XBRD ANBCTRL (SUTURE) ×2 IMPLANT
SUT VIC AB 2-0 SH 27 (SUTURE) ×4
SUT VIC AB 2-0 SH 27XBRD (SUTURE) ×2 IMPLANT
TOWEL OR 17X24 6PK STRL BLUE (TOWEL DISPOSABLE) ×3 IMPLANT
TRAY FOLEY CATH SILVER 14FR (SET/KITS/TRAYS/PACK) ×3 IMPLANT

## 2015-08-02 NOTE — Consult Note (Signed)
[facility]  Delivery Note: C-section 08/02/2015 3:05 PM  I was called to the operating room at the request of the patient's obstetrician (Dr. Kulwa) for a repeat c-section.  PRENATAL HX: 39 y/o G3P2002 at 39 and 1/[redacted] weeks gestation. Pregnancy complicated by AMA, GDM, previous c-section x2.  INTRAPARTUM HX: Repeat c-section with AROM at delivery  DELIVERY: Infant was vigorous at delivery, requiring no resuscitation other than standard warming, drying and stimulation. APGARs 8 and 9. Exam within normal limits. After 5 minutes, baby left with nurse to assist parents with skin-to-skin care.   _____________________ Electronically Signed By: Lindsey Murphy, MD Neonatologist 

## 2015-08-02 NOTE — Brief Op Note (Signed)
08/02/2015  4:21 PM  PATIENT:  Shannon Dennis  39 y.o. female  PRE-OPERATIVE DIAGNOSIS:   2 Prior Cesarean Sections desiring repeat cesarean section  POST-OPERATIVE DIAGNOSIS:  Prior Cesarean Section desiring repeat cesarean section  PROCEDURE:  Procedure(s): CESAREAN SECTION (N/A)  SURGEON:  Surgeon(s) and Role:    * Hoover Browns, MD - Primary  ASSISTANTS: CNM Nigel Bridgeman   ANESTHESIA:   regional  EBL:  Total I/O In: 2000 [I.V.:2000] Out: 900 [Urine:100; Blood:800]  BLOOD ADMINISTERED:none  DRAINS: none   LOCAL MEDICATIONS USED:  NONE  SPECIMEN:  Source of Specimen:  Cord blood  DISPOSITION OF SPECIMEN:  PATHOLOGY  COUNTS:  YES  TOURNIQUET:  * No tourniquets in log *  DICTATION: .Dragon Dictation  PLAN OF CARE: Admit to inpatient   PATIENT DISPOSITION:  PACU - hemodynamically stable.   Delay start of Pharmacological VTE agent (>24hrs) due to surgical blood loss or risk of bleeding: not applicable

## 2015-08-02 NOTE — Op Note (Signed)
DATE OF SURGERY: 08/02/2015   PREOP DIAGNOSIS:  1. 39 week 2 day  EGA intrauterine pregnancy.  2.  History of 2 prior cesarean deliveries and desiring a repeat cesarean delivery.   POSTOP DIAGNOSIS: Same as above.  PROCEDURE: Repeat low uterine segment transverse cesarean section via Pfannenstiel incision.     SURGEON: Dr.  Hoover Browns  ASSISTANT: Nigel Bridgeman, CNM  ANESTHESIA: Spinal  COMPLICATIONS: None  FINDINGS: Viable female infant in cephalic presentation, DOA, Apgar scores of 8 and 9. Dense adhesions between uterus and rectus muscles bilaterally.    EBL: 800 cc  IV FLUID: 2300 cc LR   URINE OUTPUT: 100 cc clear urine  INDICATIONS: 39 y/o P2 with 2 prior C-sections desiring a repeat cesarean delivery.      PROCEDURE:   Informed consent was obtained from the patient to undergo the procedure. She was taken to the operating room where her spinal anesthesia was found to be adequate. She was prepped and draped in the usual sterile fashion and a Foley catheter was placed. She received 2 g of IV Ancef preoperatively. A Pfannenstiel incision was made with the scalpel over the prior incision and the incision extended through the subcutaneous layer and also the fascia with the bovie. Small perforators in the subcutaneous layer were contained with the Bovie. The fascia was nicked in the midline and then was further separated from the rectus muscles bilaterally using Mayo scissors. Kochers were placed inferiorly and then superiorly to allow further separation of fascia from the rectus muscles.  Adhesions between the rectus muscles and fascia anteriorly were noted and these were carefully separated using a scalpel.  The peritoneal cavity was entered bluntly with the fingers. At this point it was also noted that there were dense adhesions between the uterus and the rectus muscles bilaterally. These adhesions were carefully dissected using the Bovie.  There was a limited lateral exposure despite  lysis of adhesions which were dense.  The Alexis retractor was placed in.     The uterus was incised with a scalpel and the incision extended bluntly bilaterally with fingers. Intact membranes were noted.  The membranes were ruptured, funic presentation was noted with head delivery. Because of limited lateral exposure on the uterus, vacuum assistance was used to help deliver the head.  Then the rest of the body was then delivered with abdominal pressure.  She delivered a viable female infant, apgar scores 8, 9.  The cord was clamped and cut. Cord blood was collected.    The uterus could not be exteriorized because of adhesions.  The placenta was delivered with gentle traction on the umbilical cord. The edges of the uterus was grasped with T. Clamps. The uterus was cleared of clots and debris with a lap.  The incision uterine incision was closed with #1 Vicryl in a running locked stitch.  A small area that bled on the left side on the serosa was contained with figure of 8 stitch of 2-0 vicryl. Irrigation was applied and suctioned out. Good hemostasis was noted over the incision but there were punctate serosal bleeding over the uterine serosa. Therefore Surgicel was applied to enhance hemostasis over this area.  The muscles were then reapproximated using chromic suture.  Fascia was closed using 0 Vicryl in a running stitch. The subcutaneous layer was irrigated and suctioned out. Small perforators were contained with the bovie.  The subcutaneous was closed over using 1-0 plain in interrupted stitches. The skin was closed using 4-0 Monocryl. Dermabond  was applied. Honeycomb was then applied. The patient was then cleaned and she was taken to the recovery room in stable condition. The neonate was also taken to the nursery in stable condition.   SPECIMEN: Placenta to L and D, umbilical cord blood  DISPOSITION: TO PACU, STABLE.

## 2015-08-02 NOTE — Interval H&P Note (Signed)
History and Physical Interval Note:  08/02/2015 2:02 PM  Jerre Simon Shannon Dennis  has presented today for surgery, with the diagnosis of Prior Cesarean Section  The various methods of treatment have been discussed with the patient and family. After consideration of risks, benefits and other options for treatment, the patient has consented to  Procedure(s): CESAREAN SECTION (N/A) as a surgical intervention .  The patient's history has been reviewed, patient examined, no change in status, stable for surgery.  I have reviewed the patient's chart and labs.  Questions were answered to the patient's satisfaction.     [facility]

## 2015-08-02 NOTE — Anesthesia Postprocedure Evaluation (Signed)
  Anesthesia Post-op Note  Patient: Shannon Dennis  Procedure(s) Performed: Procedure(s): CESAREAN SECTION (N/A)  Patient Location: PACU  Anesthesia Type:Spinal  Level of Consciousness: awake, alert , oriented and patient cooperative  Airway and Oxygen Therapy: Patient Spontanous Breathing  Post-op Pain: mild  Post-op Assessment: Post-op Vital signs reviewed, Patient's Cardiovascular Status Stable, Respiratory Function Stable, Patent Airway, No signs of Nausea or vomiting, Pain level controlled, No headache, No backache, Spinal receding and Patient able to bend at knees              Post-op Vital Signs: Reviewed and stable  Last Vitals:  Filed Vitals:   08/02/15 1747  BP:   Pulse: 71  Temp:   Resp: 20    Complications: No apparent anesthesia complications

## 2015-08-02 NOTE — Anesthesia Preprocedure Evaluation (Signed)
Anesthesia Evaluation  Patient identified by MRN, date of birth, ID band Patient awake    Reviewed: Allergy & Precautions, NPO status , Patient's Chart, lab work & pertinent test results  History of Anesthesia Complications Negative for: history of anesthetic complications  Airway Mallampati: III  TM Distance: >3 FB Neck ROM: Full    Dental  (+) Teeth Intact, Dental Advisory Given   Pulmonary neg pulmonary ROS,    Pulmonary exam normal breath sounds clear to auscultation       Cardiovascular Exercise Tolerance: Good (-) hypertensionnegative cardio ROS Normal cardiovascular exam Rhythm:Regular Rate:Normal     Neuro/Psych negative neurological ROS  negative psych ROS   GI/Hepatic negative GI ROS, Neg liver ROS,   Endo/Other  diabetes, Well Controlled, GestationalObesity   Renal/GU negative Renal ROS     Musculoskeletal negative musculoskeletal ROS (+)   Abdominal   Peds  Hematology negative hematology ROS (+)   Anesthesia Other Findings Day of surgery medications reviewed with the patient.  Reproductive/Obstetrics (+) Pregnancy                             Anesthesia Physical Anesthesia Plan  ASA: III  Anesthesia Plan: Spinal   Post-op Pain Management:    Induction:   Airway Management Planned:   Additional Equipment:   Intra-op Plan:   Post-operative Plan:   Informed Consent: I have reviewed the patients History and Physical, chart, labs and discussed the procedure including the risks, benefits and alternatives for the proposed anesthesia with the patient or authorized representative who has indicated his/her understanding and acceptance.   Dental advisory given  Plan Discussed with: CRNA, Anesthesiologist and Surgeon  Anesthesia Plan Comments: (Discussed risks and benefits of and differences between spinal and general. Discussed risks of spinal including headache,  backache, failure, bleeding, infection, and nerve damage. Patient consents to spinal. Questions answered. Coagulation studies and platelet count acceptable.)        Anesthesia Quick Evaluation

## 2015-08-02 NOTE — Transfer of Care (Signed)
Immediate Anesthesia Transfer of Care Note  Patient: Shannon Dennis  Procedure(s) Performed: Procedure(s): CESAREAN SECTION (N/A)  Patient Location: PACU  Anesthesia Type:Spinal  Level of Consciousness: awake, alert  and oriented  Airway & Oxygen Therapy: Patient Spontanous Breathing  Post-op Assessment: Report given to RN and Post -op Vital signs reviewed and stable  Post vital signs: Reviewed and stable  Last Vitals:  Filed Vitals:   08/02/15 1316  BP: 131/82  Pulse: 86  Temp: 36.8 C  Resp: 18    Complications: No apparent anesthesia complications

## 2015-08-02 NOTE — Anesthesia Procedure Notes (Signed)
Spinal Patient location during procedure: OR Start time: 08/02/2015 2:27 PM End time: 08/02/2015 2:30 PM Staffing Anesthesiologist: Cecile Hearing Performed by: anesthesiologist  Preanesthetic Checklist Completed: patient identified, surgical consent, pre-op evaluation, timeout performed, IV checked, risks and benefits discussed and monitors and equipment checked Spinal Block Patient position: sitting Prep: site prepped and draped and DuraPrep Patient monitoring: continuous pulse ox and blood pressure Approach: midline Location: L3-4 Needle Needle type: Pencan  Needle gauge: 24 G Needle length: 9 cm Additional Notes Functioning IV was confirmed and monitors were applied. Sterile prep and drape, including hand hygiene, mask and sterile gloves were used. The patient was positioned and the spine was prepped. The skin was anesthetized with lidocaine.  Free flow of clear CSF was obtained prior to injecting local anesthetic into the CSF.  The spinal needle aspirated freely following injection.  The needle was carefully withdrawn.  The patient tolerated the procedure well. Consent was obtained prior to procedure with all questions answered and concerns addressed. Risks including but not limited to bleeding, infection, nerve damage, paralysis, failed block, inadequate analgesia, allergic reaction, high spinal, itching and headache were discussed and the patient wished to proceed.   Arrie Aran, MD

## 2015-08-03 ENCOUNTER — Encounter (HOSPITAL_COMMUNITY): Payer: Self-pay | Admitting: Obstetrics & Gynecology

## 2015-08-03 LAB — CBC
HEMATOCRIT: 28.5 % — AB (ref 36.0–46.0)
HEMOGLOBIN: 9.3 g/dL — AB (ref 12.0–15.0)
MCH: 25.3 pg — AB (ref 26.0–34.0)
MCHC: 32.6 g/dL (ref 30.0–36.0)
MCV: 77.7 fL — AB (ref 78.0–100.0)
Platelets: 184 10*3/uL (ref 150–400)
RBC: 3.67 MIL/uL — ABNORMAL LOW (ref 3.87–5.11)
RDW: 14.4 % (ref 11.5–15.5)
WBC: 7.9 10*3/uL (ref 4.0–10.5)

## 2015-08-03 NOTE — Anesthesia Postprocedure Evaluation (Signed)
  Anesthesia Post-op Note  Patient: Shannon Dennis  Procedure(s) Performed: Procedure(s): CESAREAN SECTION (N/A)  Patient Location: Mother/Baby  Anesthesia Type:Spinal  Level of Consciousness: awake, alert , oriented and patient cooperative  Airway and Oxygen Therapy: Patient Spontanous Breathing  Post-op Pain: none  Post-op Assessment: Post-op Vital signs reviewed, Patient's Cardiovascular Status Stable, Respiratory Function Stable, Patent Airway, No headache, No backache and Patient able to bend at knees              Post-op Vital Signs: Reviewed and stable  Last Vitals:  Filed Vitals:   08/03/15 0410  BP: 123/73  Pulse: 63  Temp: 36.6 C  Resp: 18    Complications: No apparent anesthesia complications

## 2015-08-03 NOTE — Addendum Note (Signed)
Addendum  created 08/03/15 1004 by Yolonda Kida, CRNA   Modules edited: Notes Section   Notes Section:  File: [id number]

## 2015-08-03 NOTE — Lactation Note (Signed)
This note was copied from the chart of Shannon Dennis. Lactation Consultation Note Attempted visit at 29 hours of age.  Mom asleep with baby in her arms.  Explained to FOB he needed to keep his eyes on baby or put baby in crib while mom is sleeping.  LC to attempt visit later.   Patient Name: Shannon Shannon Dennis [id number] Date: 08/03/2015     Maternal Data    Feeding    LATCH Score/Interventions                      Lactation Tools Discussed/Used     Consult Status      Shoptaw, Arvella Merles 08/03/2015, 9:24 PM

## 2015-08-03 NOTE — Progress Notes (Signed)
Subjective: Postpartum Day 1: Cesarean Delivery due to repeat Patient up ad lib, reports no syncope or dizziness. +Flatus Feeding:  Breast and bottle. Pt concern she is not producing enough milk Contraceptive plan:  unsure  Objective: Vital signs in last 24 hours: Temp:  [97.4 F (36.3 C)-98.2 F (36.8 C)] 97.8 F (36.6 C) (09/21 0410) Pulse Rate:  [63-87] 63 (09/21 0410) Resp:  [8-27] 18 (09/21 0410) BP: (119-140)/(58-88) 123/73 mmHg (09/21 0410) SpO2:  [91 %-100 %] 92 % (09/21 0410) Weight:  [194 lb (87.998 kg)] 194 lb (87.998 kg) (09/21 0215)  Physical Exam:  General: alert and cooperative Lochia: appropriate Uterine Fundus: firm Abdomen:  + bowel sounds, non distended Incision: no significant drainage  Honeycomb dressing CDI DVT Evaluation: No evidence of DVT seen on physical exam. Homan's sign: Negative   Recent Labs  08/01/15 0845 08/03/15 0620  HGB 10.0* 9.3*  HCT 30.0* 28.5*  WBC 6.2 7.9     Assessment: Status post Cesarean section day 1. Doing well postoperatively.  Honeycomb dressing in place, no significant drainage Anemia - hemodynamicly stable.    Circumcision: out patient   Plan: Continue current care. Breastfeeding and Lactation consult Remove outer dressing    Venus Standard, CNM, MSN 08/03/2015. 9:28 AM

## 2015-08-03 NOTE — Lactation Note (Signed)
This note was copied from the chart of Shannon Dennis. Lactation Consultation Note Initial visit at 30 hours of age.  Mom is changing diaper and reports just feeding baby, but later states feeding was 1 1/2 hours ago.  Explained to mom baby will feed on demand and often for supply and demand.  Offered to assist mom with latch.  Demonstrated hand expression with colostrum visible, more like milk thin and white.  Mom was able to return demonstration of hand expression, but may need practice.  Assisted baby latch in cradle hold to get wide deep latch.  Mom reports initial latch on pain and then improved.  Baby had a few strong minutes then became sleepy.  Instructed mom on how to stimulate baby to keep him awake.  Mom has been giving several bottles of formula.  Mom reports she gave bottles to her other children until she was recovered from her c/s.  Discucced supply and demand.  Explained feeding log and how to use.  [facility] LC resources given and discussed.  Encouraged to feed with early cues on demand.  Early newborn behavior discussed.  Mom to call for assist as needed.    Patient Name: Shannon Neftaly Inzunza [id number] Date: 08/03/2015 Reason for consult: Initial assessment   Maternal Data Has patient been taught Hand Expression?: Yes Does the patient have breastfeeding experience prior to this delivery?: Yes  Feeding Feeding Type: Breast Fed Length of feed:  (several minutes observed)  LATCH Score/Interventions Latch: Repeated attempts needed to sustain latch, nipple held in mouth throughout feeding, stimulation needed to elicit sucking reflex. Intervention(s): Adjust position;Assist with latch;Breast massage;Breast compression  Audible Swallowing: A few with stimulation  Type of Nipple: Everted at rest and after stimulation  Comfort (Breast/Nipple): Soft / non-tender     Hold (Positioning): Assistance needed to correctly position infant at breast and maintain latch. Intervention(s): Breastfeeding  basics reviewed;Support Pillows;Position options;Skin to skin  LATCH Score: 7  Lactation Tools Discussed/Used WIC Program: Yes   Consult Status Consult Status: Follow-up Date: 08/04/15 Follow-up type: In-patient    Jannifer Rodney 08/03/2015, 10:12 PM

## 2015-08-04 NOTE — Progress Notes (Signed)
Subjective: Postpartum Day 2: Cesarean Delivery due to repeat Patient up ad lib, reports no syncope or dizziness. Feeding:  Breast/bottle Contraceptive plan:  unsure  Objective: Vital signs in last 24 hours: Temp:  [98.2 F (36.8 C)-99.5 F (37.5 C)] 98.7 F (37.1 C) (09/22 0610) Pulse Rate:  [74-101] 74 (09/22 0610) Resp:  [16-17] 17 (09/22 0610) BP: (121-123)/(61-72) 123/72 mmHg (09/22 0610) SpO2:  [100 %] 100 % (09/21 1850)  Physical Exam:  General: alert and cooperative Lochia: appropriate Uterine Fundus: firm Abdomen:  + bowel sounds, non distended Incision: no significant drainage  Honeycomb dressing CDI DVT Evaluation: No evidence of DVT seen on physical exam. Homan's sign: Negative   Recent Labs  08/03/15 0620  HGB 9.3*  HCT 28.5*  WBC 7.9    Assessment: Status post Cesarean section day 2. Doing well postoperatively.  Honeycomb dressing in place, no significant drainage Anemia - hemodynamicly stable.  Circumcision: out patient   Plan: Continue current care. Plan for discharge tomorrow, Breastfeeding and Lactation consult   Venus Standard, CNM, MSN 08/04/2015. 8:57 AM

## 2015-08-04 NOTE — Lactation Note (Signed)
This note was copied from the chart of Shannon Karinne Schmader. Lactation Consultation Note; Mom and baby resting now. Mom reports baby has been latching better today with no pain. Experienced BF mom. Has given some bottles of formula. Encouraged to always BF first then give formula to promote a good milk supply. No questions at present. To call prn  Patient Name: Shannon Dennis [id number] Date: 08/04/2015 Reason for consult: Follow-up assessment   Maternal Data Formula Feeding for Exclusion: No  Feeding   LATCH Score/Interventions    Lactation Tools Discussed/Used     Consult Status Consult Status: PRN    Pamelia Hoit 08/04/2015, 3:20 PM

## 2015-08-05 MED ORDER — IBUPROFEN 600 MG PO TABS: 600.0000 mg | ORAL_TABLET | Freq: Four times a day (QID) | ORAL | Status: DC | PRN

## 2015-08-05 MED ORDER — OXYCODONE-ACETAMINOPHEN 5-325 MG PO TABS: 1.0000 | ORAL_TABLET | ORAL | Status: DC | PRN

## 2015-08-05 NOTE — Lactation Note (Signed)
This note was copied from the chart of Shannon Lawsyn Heiler. Lactation Consultation Note  Baby latched in cradle upon entering.  Sucks and swallows observed. Encouraged feeding longer than 10 min.  Reviewed waking techniques. Provided mother w/ comfort gels and discussed coconut and olive oil for tender nipples. Reviewed engorgement care and provided mother w/ hand pump. Mom encouraged to feed baby 8-12 times/24 hours and with feeding cues.    Patient Name: Shannon Dennis [id number] Date: 08/05/2015 Reason for consult: Follow-up assessment   Maternal Data    Feeding Feeding Type: Breast Fed Length of feed: 0 min  LATCH Score/Interventions Latch: Grasps breast easily, tongue down, lips flanged, rhythmical sucking.  Audible Swallowing: A few with stimulation  Type of Nipple: Everted at rest and after stimulation  Comfort (Breast/Nipple): Soft / non-tender     Hold (Positioning): No assistance needed to correctly position infant at breast.  LATCH Score: 9  Lactation Tools Discussed/Used     Consult Status Consult Status: Complete    Hardie Pulley 08/05/2015, 9:07 AM

## 2015-08-05 NOTE — Discharge Summary (Signed)
  Cesarean Section Delivery Discharge Summary  [name]  DOB:    [birth year] MRN:    [id number] CSN:    [id number]  Date of admission:                  08/02/15  Date of discharge:                   08/05/15  Procedures this admission:   Repeat LTCS  Date of Delivery: 08/02/15  Newborn Data:  Live born female  Birth Weight: 7 lb 1.8 oz (3225 g) APGAR: 8, 9  Home with mother. Name: Shannon Dennis Circumcision Plan: Outpatient  History of Present Illness:  [name] is a 39 y.o. female, G3P3003, who presents at [redacted]w[redacted]d weeks gestation. The patient has been followed at  [facility]. She was admitted for cesarean section, scheduled repeat.  Her pregnancy has been complicated by:  Patient Active Problem List   Diagnosis Date Noted  . Cesarean delivery delivered 08/02/2015  . Gestational diabetes--diet controlled 07/28/2015  . Previous cesarean section x 2 07/28/2015  . AMA (advanced maternal age) multigravida 35+ 07/28/2015  . 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 Sallye Ober Complications: Dense adhesion between uterus and rectus muscles bilaterally  Additional Information:  none  Intrapartum Procedures: cesarean: low cervical, transverse Postpartum Procedures: none   Discharge Diagnoses: Term Pregnancy-delivered and previous cesarean desired repeat  Feeding:  breast and bottle  Contraception:  undecided option reviewed in discharge instructions  Hemoglobin Results:  CBC CBC Latest Ref Rng 08/03/2015 08/01/2015 04/13/2015  WBC 4.0 - 10.5 K/uL 7.9 6.2 6.5  Hemoglobin 12.0 - 15.0 g/dL 5.7(Q) 10.0(L) 11.2(L)  Hematocrit 36.0 - 46.0 % 28.5(L) 30.0(L) 31.6(L)  Platelets 150 - 400 K/uL 184 178 164       Discharge Physical Exam:    General: alert Lochia: appropriate Uterine Fundus: firm Abdomen:  + bowel sounds, +flatus Incision: Honeycomb CDI DVT Evaluation: No evidence of DVT seen on physical exam.  Discharge Information:  Activity:           pelvic rest Diet:                routine Medications: Ibuprofen and Percocet Condition:      stable Instructions: Routine postpartum and contraception  Discharge to: home  Follow-up Information    Follow up with [facility]. Schedule an appointment as soon as possible for a visit in 6 weeks.   Specialty:  Obstetrics and Gynecology   Why:  Call to schedule circumcision, Call with any questions or concerns.   Contact information:   [address] [place] [address] [phone]       Nigel Bridgeman CNM 08/05/2015 9:11 AM

## 2015-08-05 NOTE — Discharge Instructions (Signed)
Postpartum Care After Cesarean Delivery After you deliver your newborn (postpartum period), the usual stay in the hospital is 24-72 hours. If there were problems with your labor or delivery, or if you have other medical problems, you might be in the hospital longer.  While you are in the hospital, you will receive help and instructions on how to care for yourself and your newborn during the postpartum period.  While you are in the hospital:  It is normal for you to have pain or discomfort from the incision in your abdomen. Be sure to tell your nurses when you are having pain, where the pain is located, and what makes the pain worse.  If you are breastfeeding, you may feel uncomfortable contractions of your uterus for a couple of weeks. This is normal. The contractions help your uterus get back to normal size.  It is normal to have some bleeding after delivery.  For the first 1-3 days after delivery, the flow is red and the amount may be similar to a period.  It is common for the flow to start and stop.  In the first few days, you may pass some small clots. Let your nurses know if you begin to pass large clots or your flow increases.  Do not  flush blood clots down the toilet before having the nurse look at them.  During the next 3-10 days after delivery, your flow should become more watery and pink or brown-tinged in color.  Ten to fourteen days after delivery, your flow should be a small amount of yellowish-white discharge.  The amount of your flow will decrease over the first few weeks after delivery. Your flow may stop in 6-8 weeks. Most women have had their flow stop by 12 weeks after delivery.  You should change your sanitary pads frequently.  Wash your hands thoroughly with soap and water for at least 20 seconds after changing pads, using the toilet, or before holding or feeding your newborn.  Your intravenous (IV) tubing will be removed when you are drinking enough fluids.  The  urine drainage tube (urinary catheter) that was inserted before delivery may be removed within 6-8 hours after delivery or when feeling returns to your legs. You should feel like you need to empty your bladder within the first 6-8 hours after the catheter has been removed.  In case you become weak, lightheaded, or faint, call your nurse before you get out of bed for the first time and before you take a shower for the first time.  Within the first few days after delivery, your breasts may begin to feel tender and full. This is called engorgement. Breast tenderness usually goes away within 48-72 hours after engorgement occurs. You may also notice milk leaking from your breasts. If you are not breastfeeding, do not stimulate your breasts. Breast stimulation can make your breasts produce more milk.  Spending as much time as possible with your newborn is very important. During this time, you and your newborn can feel close and get to know each other. Having your newborn stay in your room (rooming in) will help to strengthen the bond with your newborn. It will give you time to get to know your newborn and become comfortable caring for your newborn.  Your hormones change after delivery. Sometimes the hormone changes can temporarily cause you to feel sad or tearful. These feelings should not last more than a few days. If these feelings last longer than that, you should talk to your  caregiver.  If desired, talk to your caregiver about methods of family planning or contraception.  Talk to your caregiver about immunizations. Your caregiver may want you to have the following immunizations before leaving the hospital:  Tetanus, diphtheria, and pertussis (Tdap) or tetanus and diphtheria (Td) immunization. It is very important that you and your family (including grandparents) or others caring for your newborn are up-to-date with the Tdap or Td immunizations. The Tdap or Td immunization can help protect your newborn  from getting ill.  Rubella immunization.  Varicella (chickenpox) immunization.  Influenza immunization. You should receive this annual immunization if you did not receive the immunization during your pregnancy. Document Released: 07/23/2012 Document Reviewed: 07/23/2012 [facility] Patient Information 2015 [place], [place]. This information is not intended to replace advice given to you by your health care provider. Make sure you discuss any questions you have with your health care provider.   Contraception Choices Contraception (birth control) is the use of any methods or devices to prevent pregnancy. Below are some methods to help avoid pregnancy. HORMONAL METHODS   Contraceptive implant. This is a thin, plastic tube containing progesterone hormone. It does not contain estrogen hormone. Your health care provider inserts the tube in the inner part of the upper arm. The tube can remain in place for up to 3 years. After 3 years, the implant must be removed. The implant prevents the ovaries from releasing an egg (ovulation), thickens the cervical mucus to prevent sperm from entering the uterus, and thins the lining of the inside of the uterus.  Progesterone-only injections. These injections are given every 3 months by your health care provider to prevent pregnancy. This synthetic progesterone hormone stops the ovaries from releasing eggs. It also thickens cervical mucus and changes the uterine lining. This makes it harder for sperm to survive in the uterus.  Birth control pills. These pills contain estrogen and progesterone hormone. They work by preventing the ovaries from releasing eggs (ovulation). They also cause the cervical mucus to thicken, preventing the sperm from entering the uterus. Birth control pills are prescribed by a health care provider.Birth control pills can also be used to treat heavy periods.  Minipill. This type of birth control pill contains only the progesterone hormone. They  are taken every day of each month and must be prescribed by your health care provider.  Birth control patch. The patch contains hormones similar to those in birth control pills. It must be changed once a week and is prescribed by a health care provider.  Vaginal ring. The ring contains hormones similar to those in birth control pills. It is left in the vagina for 3 weeks, removed for 1 week, and then a new one is put back in place. The patient must be comfortable inserting and removing the ring from the vagina.A health care provider's prescription is necessary.  Emergency contraception. Emergency contraceptives prevent pregnancy after unprotected sexual intercourse. This pill can be taken right after sex or up to 5 days after unprotected sex. It is most effective the sooner you take the pills after having sexual intercourse. Most emergency contraceptive pills are available without a prescription. Check with your pharmacist. Do not use emergency contraception as your only form of birth control. BARRIER METHODS   Female condom. This is a thin sheath (latex or rubber) that is worn over the penis during sexual intercourse. It can be used with spermicide to increase effectiveness.  Female condom. This is a soft, loose-fitting sheath that is put into the  vagina before sexual intercourse.  Diaphragm. This is a soft, latex, dome-shaped barrier that must be fitted by a health care provider. It is inserted into the vagina, along with a spermicidal jelly. It is inserted before intercourse. The diaphragm should be left in the vagina for 6 to 8 hours after intercourse.  Cervical cap. This is a round, soft, latex or plastic cup that fits over the cervix and must be fitted by a health care provider. The cap can be left in place for up to 48 hours after intercourse.  Sponge. This is a soft, circular piece of polyurethane foam. The sponge has spermicide in it. It is inserted into the vagina after wetting it and  before sexual intercourse.  Spermicides. These are chemicals that kill or block sperm from entering the cervix and uterus. They come in the form of creams, jellies, suppositories, foam, or tablets. They do not require a prescription. They are inserted into the vagina with an applicator before having sexual intercourse. The process must be repeated every time you have sexual intercourse. INTRAUTERINE CONTRACEPTION  Intrauterine device (IUD). This is a T-shaped device that is put in a woman's uterus during a menstrual period to prevent pregnancy. There are 2 types:  Copper IUD. This type of IUD is wrapped in copper wire and is placed inside the uterus. Copper makes the uterus and fallopian tubes produce a fluid that kills sperm. It can stay in place for 10 years.  Hormone IUD. This type of IUD contains the hormone progestin (synthetic progesterone). The hormone thickens the cervical mucus and prevents sperm from entering the uterus, and it also thins the uterine lining to prevent implantation of a fertilized egg. The hormone can weaken or kill the sperm that get into the uterus. It can stay in place for 3-5 years, depending on which type of IUD is used. PERMANENT METHODS OF CONTRACEPTION  Female tubal ligation. This is when the woman's fallopian tubes are surgically sealed, tied, or blocked to prevent the egg from traveling to the uterus.  Hysteroscopic sterilization. This involves placing a small coil or insert into each fallopian tube. Your doctor uses a technique called hysteroscopy to do the procedure. The device causes scar tissue to form. This results in permanent blockage of the fallopian tubes, so the sperm cannot fertilize the egg. It takes about 3 months after the procedure for the tubes to become blocked. You must use another form of birth control for these 3 months.  Female sterilization. This is when the female has the tubes that carry sperm tied off (vasectomy).This blocks sperm from  entering the vagina during sexual intercourse. After the procedure, the man can still ejaculate fluid (semen). NATURAL PLANNING METHODS  Natural family planning. This is not having sexual intercourse or using a barrier method (condom, diaphragm, cervical cap) on days the woman could become pregnant.  Calendar method. This is keeping track of the length of each menstrual cycle and identifying when you are fertile.  Ovulation method. This is avoiding sexual intercourse during ovulation.  Symptothermal method. This is avoiding sexual intercourse during ovulation, using a thermometer and ovulation symptoms.  Post-ovulation method. This is timing sexual intercourse after you have ovulated. Regardless of which type or method of contraception you choose, it is important that you use condoms to protect against the transmission of sexually transmitted infections (STIs). Talk with your health care provider about which form of contraception is most appropriate for you.   Document Released: 10/29/2005 Document Revised: 11/03/2013 Document  Reviewed: 04/23/2013 [employer] Patient Information 2015 [place], [place]. This information is not intended to replace advice given to you by your health care provider. Make sure you discuss any questions you have with your health care provider.

## 2016-11-09 ENCOUNTER — Encounter (HOSPITAL_COMMUNITY): Payer: Self-pay | Admitting: *Deleted

## 2016-11-09 ENCOUNTER — Ambulatory Visit (HOSPITAL_COMMUNITY)
Admission: EM | Admit: 2016-11-09 | Discharge: 2016-11-09 | Disposition: A | Discharge status: Home / Self Care | Payer: Self-pay | Attending: Family Medicine | Admitting: Family Medicine

## 2016-11-09 DIAGNOSIS — B86 Scabies: Secondary | ICD-10-CM

## 2016-11-09 MED ORDER — HYDROCORTISONE 1 % EX CREA: TOPICAL_CREAM | CUTANEOUS | 0 refills | Status: DC

## 2016-11-09 MED ORDER — PERMETHRIN 5 % EX CREA: TOPICAL_CREAM | CUTANEOUS | 1 refills | Status: DC

## 2016-11-09 NOTE — ED Provider Notes (Signed)
CSN: [id number]     Arrival date & time 11/09/16  1552 History   None    Chief Complaint  Patient presents with  . Rash   (Consider location/radiation/quality/duration/timing/severity/associated sxs/prior Treatment) The history is provided by the patient. No language interpreter was used.  Rash  Location:  Full body Severity:  Moderate Onset quality:  Gradual Timing:  Constant Relieved by:  Nothing Worsened by:  Nothing Ineffective treatments:  None tried Pt complains of a rash.  Child had scabies  Past Medical History:  Diagnosis Date  . Gestational diabetes 2012   Past Surgical History:  Procedure Laterality Date  . BREAST SURGERY     right  . CESAREAN SECTION     one previous  . CESAREAN SECTION N/A 08/02/2015   Procedure: CESAREAN SECTION;  Surgeon: Hoover BrownsEma Kulwa, MD;  Location: [facility];  Service: Obstetrics;  Laterality: N/A;   Family History  Problem Relation Age of Onset  . Diabetes Mother   . Hypertension Mother   . Diabetes Father   . Hypertension Father    Social History  Substance Use Topics  . Smoking status: Never Smoker  . Smokeless tobacco: Never Used  . Alcohol use No   OB History    Gravida Para Term Preterm AB Living   3 3 3     3    SAB TAB Ectopic Multiple Live Births         0 3     Review of Systems  Skin: Positive for rash.  All other systems reviewed and are negative.   Allergies  Patient has no known allergies.  Home Medications   Prior to Admission medications   Medication Sig Start Date End Date Taking? Authorizing Provider  acetaminophen (TYLENOL) 325 MG tablet Take 650 mg by mouth every 6 (six) hours as needed for mild pain.     Historical Provider, MD  hydrocortisone cream 1 % Apply to affected area 2 times daily 11/09/16   Elson AreasLeslie K Sofia, PA-C  ibuprofen (ADVIL,MOTRIN) 600 MG tablet Take 1 tablet (600 mg total) by mouth every 6 (six) hours as needed. 08/05/15   Nigel BridgemanVicki Latham, CNM  oxyCODONE-acetaminophen (PERCOCET/ROXICET)  5-325 MG per tablet Take 1 tablet by mouth every 4 (four) hours as needed (for pain scale 4-7). 08/05/15   Nigel BridgemanVicki Latham, CNM  permethrin (ELIMITE) 5 % cream Apply to affected area once 11/09/16   Elson AreasLeslie K Sofia, PA-C  Prenatal Vit-Fe Fumarate-FA (PRENATAL MULTIVITAMIN) TABS tablet Take 1 tablet by mouth daily at 12 noon.    Historical Provider, MD   Meds Ordered and Administered this Visit  Medications - No data to display  BP 127/84   Pulse 65   Temp 98.3 F (36.8 C) (Oral)   Resp 14   LMP 11/08/2016   SpO2 98%  No data found.   Physical Exam  Constitutional: She appears well-developed and well-nourished.  Eyes: Pupils are equal, round, and reactive to light.  Neck: Normal range of motion.  Cardiovascular: Normal rate.   Pulmonary/Chest: Effort normal.  Abdominal: Soft.  Musculoskeletal: Normal range of motion.  Neurological: She is alert.  Skin: Skin is warm. Rash noted.  Nursing note and vitals reviewed.   Urgent Care Course   Clinical Course     Procedures (including critical care time)  Labs Review Labs Reviewed - No data to display  Imaging Review No results found.   Visual Acuity Review  Right Eye Distance:   Left Eye Distance:   Bilateral Distance:  Right Eye Near:   Left Eye Near:    Bilateral Near:         MDM   1. Scabies    Meds ordered this encounter  Medications  . permethrin (ELIMITE) 5 % cream    Sig: Apply to affected area once    Dispense:  60 g    Refill:  1    Order Specific Question:   Supervising Provider    Answer:   Linna HoffKINDL, JAMES D [phone][[id number]]  . hydrocortisone cream 1 %    Sig: Apply to affected area 2 times daily    Dispense:  30 g    Refill:  0    Order Specific Question:   Supervising Provider    Answer:   Linna HoffKINDL, JAMES D [phone][[id number]]  An After Visit Summary was printed and given to the patient.    Lonia SkinnerLeslie K HastingsSofia, PA-C 11/09/16 [phone]

## 2016-11-09 NOTE — ED Triage Notes (Signed)
Pt  Has   Rash  On     Various  Parts  Of  Her  Body  X  1  Week       denys  Any   New    Medications      Child  Was   Seen  For scabies  About  1  Week  Ago      Pt  Appears  In no  Acute  Distress  At this  Time

## 2016-12-18 ENCOUNTER — Ambulatory Visit (HOSPITAL_COMMUNITY)
Admission: EM | Admit: 2016-12-18 | Discharge: 2016-12-18 | Disposition: A | Discharge status: Home / Self Care | Payer: Self-pay | Attending: Family Medicine | Admitting: Family Medicine

## 2016-12-18 ENCOUNTER — Encounter (HOSPITAL_COMMUNITY): Payer: Self-pay | Admitting: Emergency Medicine

## 2016-12-18 DIAGNOSIS — R05 Cough: Secondary | ICD-10-CM

## 2016-12-18 DIAGNOSIS — R059 Cough, unspecified: Secondary | ICD-10-CM

## 2016-12-18 DIAGNOSIS — R6889 Other general symptoms and signs: Secondary | ICD-10-CM

## 2016-12-18 MED ORDER — OSELTAMIVIR PHOSPHATE 75 MG PO CAPS: 75.0000 mg | ORAL_CAPSULE | Freq: Two times a day (BID) | ORAL | 0 refills | Status: DC

## 2016-12-18 MED ORDER — BENZONATATE 100 MG PO CAPS: 100.0000 mg | ORAL_CAPSULE | Freq: Three times a day (TID) | ORAL | 0 refills | Status: DC

## 2016-12-18 NOTE — ED Provider Notes (Signed)
CSN: [id number]     Arrival date & time 12/18/16  1638 History   First MD Initiated Contact with Patient 12/18/16 1820     Chief Complaint  Patient presents with  . Headache   (Consider location/radiation/quality/duration/timing/severity/associated sxs/prior Treatment) Patient c/o feve and headache x 2 days   The history is provided by the patient.  Headache  Pain location:  Generalized Radiates to:  Does not radiate Severity currently:  5/10 Severity at highest:  9/10 Onset quality:  Sudden Duration:  3 days Timing:  Intermittent Progression:  Waxing and waning Chronicity:  New Similar to prior headaches: no   Relieved by:  None tried Worsened by:  Activity Ineffective treatments:  Resting in a darkened room Associated symptoms: fatigue, fever and nausea     Past Medical History:  Diagnosis Date  . Gestational diabetes 2012   Past Surgical History:  Procedure Laterality Date  . BREAST SURGERY     right  . CESAREAN SECTION     one previous  . CESAREAN SECTION N/A 08/02/2015   Procedure: CESAREAN SECTION;  Surgeon: Hoover Browns, MD;  Location: [facility];  Service: Obstetrics;  Laterality: N/A;   Family History  Problem Relation Age of Onset  . Diabetes Mother   . Hypertension Mother   . Diabetes Father   . Hypertension Father    Social History  Substance Use Topics  . Smoking status: Never Smoker  . Smokeless tobacco: Never Used  . Alcohol use No   OB History    Gravida Para Term Preterm AB Living   3 3 3     3    SAB TAB Ectopic Multiple Live Births         0 3     Review of Systems  Constitutional: Positive for fatigue and fever.  HENT: Negative.   Eyes: Negative.   Respiratory: Negative.   Cardiovascular: Negative.   Gastrointestinal: Positive for nausea.  Endocrine: Negative.   Genitourinary: Negative.   Musculoskeletal: Negative.   Allergic/Immunologic: Negative.   Neurological: Positive for headaches.  Hematological: Negative.    Psychiatric/Behavioral: Negative.     Allergies  Patient has no known allergies.  Home Medications   Prior to Admission medications   Medication Sig Start Date End Date Taking? Authorizing Provider  benzonatate (TESSALON) 100 MG capsule Take 1 capsule (100 mg total) by mouth every 8 (eight) hours. 12/18/16   Deatra Canter, FNP  oseltamivir (TAMIFLU) 75 MG capsule Take 1 capsule (75 mg total) by mouth every 12 (twelve) hours. 12/18/16   Deatra Canter, FNP   Meds Ordered and Administered this Visit  Medications - No data to display  BP 121/76 (BP Location: Right Arm)   Pulse 83   Temp 99.4 F (37.4 C) (Oral)   Resp 18   SpO2 100%  No data found.   Physical Exam  Constitutional: She appears well-developed and well-nourished.  HENT:  Head: Normocephalic and atraumatic.  Right Ear: External ear normal.  Left Ear: External ear normal.  opx dry  Eyes: Conjunctivae and EOM are normal. Pupils are equal, round, and reactive to light.  Neck: Normal range of motion. Neck supple.  Cardiovascular: Regular rhythm and normal heart sounds.   Tachycardia  Pulmonary/Chest: Effort normal and breath sounds normal.  Abdominal: Soft. Bowel sounds are normal.  Nursing note and vitals reviewed.   Urgent Care Course     Procedures (including critical care time)  Labs Review Labs Reviewed - No data to display  Imaging Review No results found.   Visual Acuity Review  Right Eye Distance:   Left Eye Distance:   Bilateral Distance:    Right Eye Near:   Left Eye Near:    Bilateral Near:         MDM  Flu like symptoms Headache  Tamiflu  Push po fluids, rest, tylenol and motrin otc prn as directed for fever, arthralgias, and myalgias.  Follow up prn if sx's continue or persist.    Deatra CanterWilliam J Hau Sanor, FNP 12/18/16 [phone]

## 2016-12-18 NOTE — ED Triage Notes (Signed)
[facility] a [facility] fever, headache and cough x 2 days.

## 2017-10-02 ENCOUNTER — Other Ambulatory Visit (HOSPITAL_COMMUNITY): Payer: Self-pay | Admitting: Obstetrics and Gynecology

## 2017-10-02 DIAGNOSIS — N979 Female infertility, unspecified: Secondary | ICD-10-CM

## 2017-11-01 ENCOUNTER — Ambulatory Visit (HOSPITAL_COMMUNITY): Admission: RE | Admit: 2017-11-01 | Payer: Self-pay | Source: Ambulatory Visit

## 2018-01-23 ENCOUNTER — Other Ambulatory Visit (HOSPITAL_COMMUNITY): Payer: Self-pay | Admitting: Obstetrics and Gynecology

## 2018-01-23 DIAGNOSIS — Z3491 Encounter for supervision of normal pregnancy, unspecified, first trimester: Secondary | ICD-10-CM

## 2018-01-24 ENCOUNTER — Other Ambulatory Visit (HOSPITAL_COMMUNITY): Payer: Self-pay | Admitting: Obstetrics and Gynecology

## 2018-01-24 ENCOUNTER — Ambulatory Visit (HOSPITAL_COMMUNITY)
Admission: RE | Admit: 2018-01-24 | Discharge: 2018-01-24 | Disposition: A | Discharge status: Home / Self Care | Payer: Medicaid Other | Source: Ambulatory Visit | Attending: Obstetrics and Gynecology | Admitting: Obstetrics and Gynecology

## 2018-01-24 DIAGNOSIS — Z3491 Encounter for supervision of normal pregnancy, unspecified, first trimester: Secondary | ICD-10-CM | POA: Insufficient documentation

## 2018-01-24 DIAGNOSIS — O0000 Abdominal pregnancy without intrauterine pregnancy: Secondary | ICD-10-CM

## 2018-01-24 DIAGNOSIS — Z3A01 Less than 8 weeks gestation of pregnancy: Secondary | ICD-10-CM | POA: Insufficient documentation

## 2018-02-25 LAB — OB RESULTS CONSOLE ANTIBODY SCREEN: ANTIBODY SCREEN: NEGATIVE

## 2018-02-25 LAB — OB RESULTS CONSOLE GC/CHLAMYDIA
Chlamydia: NEGATIVE
Gonorrhea: NEGATIVE

## 2018-02-25 LAB — OB RESULTS CONSOLE HIV ANTIBODY (ROUTINE TESTING): HIV: NONREACTIVE

## 2018-02-25 LAB — OB RESULTS CONSOLE RPR: RPR: NONREACTIVE

## 2018-02-25 LAB — OB RESULTS CONSOLE HEPATITIS B SURFACE ANTIGEN: HEP B S AG: NEGATIVE

## 2018-02-25 LAB — OB RESULTS CONSOLE ABO/RH: RH TYPE: POSITIVE

## 2018-02-25 LAB — OB RESULTS CONSOLE RUBELLA ANTIBODY, IGM: Rubella: IMMUNE

## 2018-02-26 ENCOUNTER — Other Ambulatory Visit: Payer: Self-pay

## 2018-03-11 ENCOUNTER — Other Ambulatory Visit (HOSPITAL_COMMUNITY): Payer: Self-pay | Admitting: *Deleted

## 2018-03-11 ENCOUNTER — Ambulatory Visit (HOSPITAL_COMMUNITY)
Admission: RE | Admit: 2018-03-11 | Discharge: 2018-03-11 | Disposition: A | Discharge status: Home / Self Care | Payer: Medicaid Other | Source: Ambulatory Visit | Attending: Obstetrics and Gynecology | Admitting: Obstetrics and Gynecology

## 2018-03-11 DIAGNOSIS — O09529 Supervision of elderly multigravida, unspecified trimester: Secondary | ICD-10-CM

## 2018-03-11 DIAGNOSIS — O289 Unspecified abnormal findings on antenatal screening of mother: Secondary | ICD-10-CM

## 2018-03-13 ENCOUNTER — Encounter (HOSPITAL_COMMUNITY): Payer: Self-pay | Admitting: MS"

## 2018-03-13 DIAGNOSIS — O289 Unspecified abnormal findings on antenatal screening of mother: Secondary | ICD-10-CM | POA: Insufficient documentation

## 2018-03-13 DIAGNOSIS — Z3A12 12 weeks gestation of pregnancy: Secondary | ICD-10-CM | POA: Insufficient documentation

## 2018-03-13 NOTE — Progress Notes (Signed)
Genetic Counseling  High-Risk Gestation Note  Appointment Date:  03/11/18 Referred By: Nigel Bridgeman, CNM Date of Birth:  1976-09-23 Partner:  Monir Abdelrahim   Pregnancy History: [id number] Estimated Date of Delivery: 09/21/18 Estimated Gestational Age: [redacted]w[redacted]d Attending: Charlsie Merles, MD   Shannon Dennis was seen for genetic counseling because of abnormal results on noninvasive prenatal screening (NIPS)/prenatal cell free DNA testing through her OB provider. Specifically, she had Panorama through [place] [facility] that indicated high risk for triploidy, multiple gestation, or vanishing twin. The patient is 42 y.o.. Arabic/English medical interpreter was present for all of today's visit, but the patient was comfortable with English during today's visit, not requiring assistance from medical interpreter.     In summary:  Discussed AMA and associated risk for fetal aneuploidy  Discussed result of NIPS (Panorama through [facility])  NIPS detected additional DNA in sample which was reported as high risk for vanishing twin, unrecognized multiple gestation, or fetal triploidy  Patient previously had ultrasound at [redacted]w[redacted]d [facility]) and at [redacted]w[redacted]d [facility] provider) that each reported singleton gestation  Discussed that unidentified multiple gestation would be unlikely given the previous ultrasounds in pregnancy  Discussed that no evidence of vanishing twin was reported on either ultrasound  Increased risk for triploidy based on result  Discussed that other factors cannot be ruled out, such as underlying maternal aneuploidy and confined placental mosaicism  Discussed increased risk for triploidy in pregnancy based on NIPS result  Spent time discussing chromosomes, triploidy, and prognosis with triploidy  Discussed options for screening  Ultrasound- scheduled 04/01/18   Discussed diagnostic testing options  CVS- declined  Amniocentesis- declined  Reviewed family history  concerns  She was counseled regarding maternal age and the association with risk for chromosome conditions due to nondisjunction with aging of the ova.   We reviewed chromosomes, nondisjunction, and the associated risk for fetal aneuploidy related to a maternal age of 42 y.o. at [redacted]w[redacted]d gestation.  She was counseled that the risk for aneuploidy decreases as gestational age increases, accounting for those pregnancies which spontaneously abort.  We specifically discussed Down syndrome (trisomy 31), trisomies 66 and 5, and sex chromosome aneuploidies (47,XXX and 47,XXY) including the common features and prognoses of each.   Shannon Dennis had noninvasive prenatal screening (NIPS)/prenatal cell free DNA testing facilitated through her OB provider. Specifically, she had Panorama through [place] [facility] which resulted as high risk for vanishing twin, unrecognized multiple gestation, or increased risk for fetal triploidy. We spent time reviewing the methodology of NIPS, specifically the single nucleotide polymorphism (SNP) based technology. We discussed that the report indicates that an additional set of DNA contribution was identified in the NIPS sample. We reviewed that this can be reflective of demise of a twin, where remaining placental cell free DNA is in maternal blood stream, unrecognized multiple gestation, or fetal triploidy. We discussed that there could be other underlying factors including underlying maternal aneuploidy or aneuploidy confined to the placenta. We reviewed that Shannon Dennis has previously had two ultrasounds in the pregnancy, which each noted single intrauterine gestation.  The first occurred on 01/24/18 at [redacted]w[redacted]d through [facility], which was reported as a single intrauterine gestation. Shannon Dennis also had ultrasound with her OB provider on  02/25/18, which noted the pregnancy to be [redacted]w[redacted]d gestation and also reported single intrauterine pregnancy. No evidence of vanishing twin was noted in either  ultrasound report. We discussed that an unrecognized twin gestation would be highly unlikely given these two prior ultrasounds.  We also discussed that the likelihood of a vanishing twin would also be unlikely given the reports from each previous ultrasound but that this cannot be definitively ruled out. Given the increased suspicion for fetal triploidy, we spent time discussing this with the patient.   Triploidy occurs when a whole extra set of chromosomes is present in a pregnancy, meaning that 17 chromosomes are present in every cell instead of 46.  Therefore, there are three copies of each chromosome instead of two copies.  Triploidy results when either: (1) the sperm contributes twice as many chromosomes as expected due to a mistake in cell division that create the sperm (diandry), (2) two sperm simultaneously fertilize an egg (diandry), or (3) the egg contributes twice as many chromosomes as expected due to a mistake in cell division (digyny).  Triploidy is relatively common in early pregnancy, and accounts for up to 3% of recognized conceptions.  Approximately 99.9% of triploid pregnancies are lost as an early first trimester miscarriage or pass away in utero in the second trimester.  Very few survive to the time of prenatal diagnosis (amniocentesis).  Of the very rare instances when triploid pregnancies survive to term, virtually all pass away within a few days time. Triploidy is generally thought to be a sporadic condition and the chance for future pregnancies is thought to be low.  We reviewed the variable features of triploidy, which can include variable fetal anomalies, growth restriction, and placental changes.   We discussed the benefits and limitations of ultrasound as a screening tool for fetal triploidy. Ultrasound was not scheduled/performed today. We discussed the option of scheduling a nuchal translucency ultrasound, given the patient's current gestational age, including benefits and  limitations in assessing for fetal triploidy. We also discussed the benefits and limitations of detailed ultrasound in the second trimester. After detailed discussion and careful consideration, Ms. Ziehm declined NT ultrasound and elected to pursue early second trimester detailed ultrasound at approximately 15 weeks. Ultrasound is scheduled in our office for 04/01/18.   We discussed the diagnostic options of chorionic villus sampling (CVS) and amniocentesis for karyotype. We discussed the risks, benefits, and limitations including the associated 1 in 100 risk for complications with CVS and the associated 1 in 300-500 risk for complications with amniocentesis, including spontaneous pregnancy loss for each. Ms. Klaus declined both CVS and amniocentesis in the pregnancy. She understands that screening tests cannot rule out all birth defects or genetic syndromes.   Both family histories were reviewed and found to be noncontributory for birth defects, intellectual disability, and known genetic conditions. The couple are each originally from [place]. The patient reported no known consanguinity for the couple. Without further information regarding the provided family history, an accurate genetic risk cannot be calculated. Further genetic counseling is warranted if more information is obtained.  Ms. LINNET BOTTARI denied exposure to environmental toxins or chemical agents. She denied the use of alcohol, tobacco or street drugs. She denied significant viral illnesses during the course of her pregnancy.   I counseled Ms. Karin Golden regarding the above risks and available options.  The approximate face-to-face time with the genetic counselor was 40 minutes.  Quinn Plowman, MS,  Certified Genetic Counselor 03/13/2018

## 2018-04-01 ENCOUNTER — Other Ambulatory Visit (HOSPITAL_COMMUNITY): Payer: Self-pay | Admitting: Maternal and Fetal Medicine

## 2018-04-01 ENCOUNTER — Encounter (HOSPITAL_COMMUNITY): Payer: Self-pay

## 2018-04-01 ENCOUNTER — Ambulatory Visit (HOSPITAL_COMMUNITY)
Admission: RE | Admit: 2018-04-01 | Discharge: 2018-04-01 | Disposition: A | Discharge status: Home / Self Care | Payer: Medicaid Other | Source: Ambulatory Visit | Attending: Obstetrics and Gynecology | Admitting: Obstetrics and Gynecology

## 2018-04-01 ENCOUNTER — Other Ambulatory Visit (HOSPITAL_COMMUNITY): Payer: Self-pay | Admitting: *Deleted

## 2018-04-01 DIAGNOSIS — O285 Abnormal chromosomal and genetic finding on antenatal screening of mother: Secondary | ICD-10-CM

## 2018-04-01 DIAGNOSIS — O281 Abnormal biochemical finding on antenatal screening of mother: Secondary | ICD-10-CM | POA: Diagnosis present

## 2018-04-01 DIAGNOSIS — O99212 Obesity complicating pregnancy, second trimester: Secondary | ICD-10-CM

## 2018-04-01 DIAGNOSIS — Z3A15 15 weeks gestation of pregnancy: Secondary | ICD-10-CM

## 2018-04-01 DIAGNOSIS — Z3689 Encounter for other specified antenatal screening: Secondary | ICD-10-CM

## 2018-04-01 DIAGNOSIS — Z8632 Personal history of gestational diabetes: Secondary | ICD-10-CM

## 2018-04-01 DIAGNOSIS — Z98891 History of uterine scar from previous surgery: Secondary | ICD-10-CM

## 2018-04-01 DIAGNOSIS — Z363 Encounter for antenatal screening for malformations: Secondary | ICD-10-CM | POA: Insufficient documentation

## 2018-04-01 DIAGNOSIS — O09522 Supervision of elderly multigravida, second trimester: Secondary | ICD-10-CM

## 2018-04-01 DIAGNOSIS — O09529 Supervision of elderly multigravida, unspecified trimester: Secondary | ICD-10-CM

## 2018-04-22 ENCOUNTER — Encounter (HOSPITAL_COMMUNITY): Payer: Self-pay

## 2018-04-22 ENCOUNTER — Other Ambulatory Visit (HOSPITAL_COMMUNITY): Payer: Self-pay | Admitting: *Deleted

## 2018-04-22 ENCOUNTER — Other Ambulatory Visit (HOSPITAL_COMMUNITY): Payer: Self-pay | Admitting: Maternal & Fetal Medicine

## 2018-04-22 ENCOUNTER — Ambulatory Visit (HOSPITAL_COMMUNITY)
Admission: RE | Admit: 2018-04-22 | Discharge: 2018-04-22 | Disposition: A | Discharge status: Home / Self Care | Payer: Medicaid Other | Source: Ambulatory Visit | Attending: Obstetrics and Gynecology | Admitting: Obstetrics and Gynecology

## 2018-04-22 DIAGNOSIS — Z362 Encounter for other antenatal screening follow-up: Secondary | ICD-10-CM | POA: Diagnosis not present

## 2018-04-22 DIAGNOSIS — O285 Abnormal chromosomal and genetic finding on antenatal screening of mother: Secondary | ICD-10-CM | POA: Insufficient documentation

## 2018-04-22 DIAGNOSIS — O09522 Supervision of elderly multigravida, second trimester: Secondary | ICD-10-CM

## 2018-04-22 DIAGNOSIS — O99212 Obesity complicating pregnancy, second trimester: Secondary | ICD-10-CM | POA: Insufficient documentation

## 2018-04-22 DIAGNOSIS — Z0489 Encounter for examination and observation for other specified reasons: Secondary | ICD-10-CM

## 2018-04-22 DIAGNOSIS — Z3A18 18 weeks gestation of pregnancy: Secondary | ICD-10-CM | POA: Diagnosis not present

## 2018-04-22 DIAGNOSIS — Z8632 Personal history of gestational diabetes: Secondary | ICD-10-CM

## 2018-04-22 DIAGNOSIS — O34219 Maternal care for unspecified type scar from previous cesarean delivery: Secondary | ICD-10-CM

## 2018-04-22 DIAGNOSIS — O09292 Supervision of pregnancy with other poor reproductive or obstetric history, second trimester: Secondary | ICD-10-CM | POA: Insufficient documentation

## 2018-04-22 DIAGNOSIS — O09299 Supervision of pregnancy with other poor reproductive or obstetric history, unspecified trimester: Secondary | ICD-10-CM

## 2018-04-22 DIAGNOSIS — IMO0002 Reserved for concepts with insufficient information to code with codable children: Secondary | ICD-10-CM

## 2018-04-24 ENCOUNTER — Encounter (HOSPITAL_COMMUNITY): Payer: Self-pay

## 2018-04-29 ENCOUNTER — Other Ambulatory Visit: Payer: Self-pay

## 2018-04-29 ENCOUNTER — Inpatient Hospital Stay (HOSPITAL_COMMUNITY)
Admission: AD | Admit: 2018-04-29 | Discharge: 2018-04-29 | Disposition: A | Discharge status: Home / Self Care | Payer: Medicaid Other | Source: Ambulatory Visit | Attending: Obstetrics and Gynecology | Admitting: Obstetrics and Gynecology

## 2018-04-29 ENCOUNTER — Encounter (HOSPITAL_COMMUNITY): Payer: Self-pay

## 2018-04-29 DIAGNOSIS — Z8249 Family history of ischemic heart disease and other diseases of the circulatory system: Secondary | ICD-10-CM | POA: Insufficient documentation

## 2018-04-29 DIAGNOSIS — Z3A19 19 weeks gestation of pregnancy: Secondary | ICD-10-CM | POA: Insufficient documentation

## 2018-04-29 DIAGNOSIS — M545 Low back pain: Secondary | ICD-10-CM | POA: Insufficient documentation

## 2018-04-29 DIAGNOSIS — T1490XA Injury, unspecified, initial encounter: Secondary | ICD-10-CM | POA: Insufficient documentation

## 2018-04-29 DIAGNOSIS — O26892 Other specified pregnancy related conditions, second trimester: Secondary | ICD-10-CM | POA: Diagnosis not present

## 2018-04-29 DIAGNOSIS — O9A212 Injury, poisoning and certain other consequences of external causes complicating pregnancy, second trimester: Secondary | ICD-10-CM | POA: Diagnosis not present

## 2018-04-29 DIAGNOSIS — Z3492 Encounter for supervision of normal pregnancy, unspecified, second trimester: Secondary | ICD-10-CM

## 2018-04-29 DIAGNOSIS — O09522 Supervision of elderly multigravida, second trimester: Secondary | ICD-10-CM | POA: Diagnosis present

## 2018-04-29 DIAGNOSIS — R51 Headache: Secondary | ICD-10-CM | POA: Insufficient documentation

## 2018-04-29 DIAGNOSIS — Y92410 Unspecified street and highway as the place of occurrence of the external cause: Secondary | ICD-10-CM | POA: Insufficient documentation

## 2018-04-29 MED ORDER — OXYCODONE-ACETAMINOPHEN 5-325 MG PO TABS
1.0000 | ORAL_TABLET | Freq: Once | ORAL | Status: AC
  Administered 2018-04-29: 1 via ORAL
  Filled 2018-04-29: qty 1

## 2018-04-29 MED ORDER — CYCLOBENZAPRINE HCL 5 MG PO TABS
5.0000 mg | ORAL_TABLET | Freq: Once | ORAL | Status: AC
  Administered 2018-04-29: 5 mg via ORAL
  Filled 2018-04-29: qty 1
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
## 2018-04-29 NOTE — MAU Note (Signed)
Urine sent to lab 

## 2018-06-03 ENCOUNTER — Ambulatory Visit (HOSPITAL_BASED_OUTPATIENT_CLINIC_OR_DEPARTMENT_OTHER)
Admission: RE | Admit: 2018-06-03 | Discharge: 2018-06-03 | Disposition: A | Discharge status: Home / Self Care | Payer: Medicaid Other | Source: Ambulatory Visit | Attending: Obstetrics and Gynecology | Admitting: Obstetrics and Gynecology

## 2018-06-03 ENCOUNTER — Ambulatory Visit (HOSPITAL_COMMUNITY)
Admission: RE | Admit: 2018-06-03 | Discharge: 2018-06-03 | Disposition: A | Discharge status: Home / Self Care | Payer: Medicaid Other | Source: Ambulatory Visit | Attending: Obstetrics and Gynecology | Admitting: Obstetrics and Gynecology

## 2018-06-03 ENCOUNTER — Other Ambulatory Visit (HOSPITAL_COMMUNITY): Payer: Self-pay | Admitting: Maternal and Fetal Medicine

## 2018-06-03 ENCOUNTER — Encounter (HOSPITAL_COMMUNITY): Payer: Self-pay

## 2018-06-03 DIAGNOSIS — O285 Abnormal chromosomal and genetic finding on antenatal screening of mother: Secondary | ICD-10-CM

## 2018-06-03 DIAGNOSIS — O09299 Supervision of pregnancy with other poor reproductive or obstetric history, unspecified trimester: Secondary | ICD-10-CM

## 2018-06-03 DIAGNOSIS — O99212 Obesity complicating pregnancy, second trimester: Secondary | ICD-10-CM

## 2018-06-03 DIAGNOSIS — Z362 Encounter for other antenatal screening follow-up: Secondary | ICD-10-CM

## 2018-06-03 DIAGNOSIS — O09522 Supervision of elderly multigravida, second trimester: Secondary | ICD-10-CM

## 2018-06-03 DIAGNOSIS — O09292 Supervision of pregnancy with other poor reproductive or obstetric history, second trimester: Secondary | ICD-10-CM

## 2018-06-03 DIAGNOSIS — Z3A24 24 weeks gestation of pregnancy: Secondary | ICD-10-CM

## 2018-06-03 DIAGNOSIS — O34219 Maternal care for unspecified type scar from previous cesarean delivery: Secondary | ICD-10-CM

## 2018-06-04 ENCOUNTER — Other Ambulatory Visit (HOSPITAL_COMMUNITY): Payer: Self-pay | Admitting: *Deleted

## 2018-06-04 DIAGNOSIS — O285 Abnormal chromosomal and genetic finding on antenatal screening of mother: Secondary | ICD-10-CM

## 2018-07-01 ENCOUNTER — Ambulatory Visit (HOSPITAL_COMMUNITY)
Admission: RE | Admit: 2018-07-01 | Discharge: 2018-07-01 | Disposition: A | Discharge status: Home / Self Care | Payer: Medicaid Other | Source: Ambulatory Visit | Attending: Obstetrics and Gynecology | Admitting: Obstetrics and Gynecology

## 2018-07-01 ENCOUNTER — Other Ambulatory Visit (HOSPITAL_COMMUNITY): Payer: Self-pay | Admitting: Obstetrics and Gynecology

## 2018-07-01 DIAGNOSIS — O09523 Supervision of elderly multigravida, third trimester: Secondary | ICD-10-CM

## 2018-07-01 DIAGNOSIS — O285 Abnormal chromosomal and genetic finding on antenatal screening of mother: Secondary | ICD-10-CM | POA: Diagnosis present

## 2018-07-01 DIAGNOSIS — Z3A28 28 weeks gestation of pregnancy: Secondary | ICD-10-CM | POA: Diagnosis present

## 2018-07-01 DIAGNOSIS — O99213 Obesity complicating pregnancy, third trimester: Secondary | ICD-10-CM

## 2018-07-01 DIAGNOSIS — E669 Obesity, unspecified: Secondary | ICD-10-CM | POA: Diagnosis not present

## 2018-07-01 DIAGNOSIS — O34219 Maternal care for unspecified type scar from previous cesarean delivery: Secondary | ICD-10-CM

## 2018-07-21 ENCOUNTER — Other Ambulatory Visit: Payer: Self-pay | Admitting: Obstetrics & Gynecology

## 2018-07-23 ENCOUNTER — Encounter: Payer: Self-pay | Admitting: Registered"

## 2018-07-23 ENCOUNTER — Encounter: Payer: Medicaid Other | Attending: Obstetrics and Gynecology | Admitting: Registered"

## 2018-07-23 DIAGNOSIS — Z713 Dietary counseling and surveillance: Secondary | ICD-10-CM | POA: Insufficient documentation

## 2018-07-23 DIAGNOSIS — O99810 Abnormal glucose complicating pregnancy: Secondary | ICD-10-CM | POA: Diagnosis not present

## 2018-07-23 NOTE — Progress Notes (Signed)
Patient was seen on 07/23/18 for Gestational Diabetes self-management class at the [facility]. The following learning objectives were met by the patient during this course:   States the definition of Gestational Diabetes  States why dietary management is important in controlling blood glucose  Describes the effects each nutrient has on blood glucose levels  Demonstrates ability to create a balanced meal plan  Demonstrates carbohydrate counting   States when to check blood glucose levels  Demonstrates proper blood glucose monitoring techniques  States the effect of stress and exercise on blood glucose levels  States the importance of limiting caffeine and abstaining from alcohol and smoking  Blood glucose monitor given: Accu-chek Guide Lot # [id number] Exp: 03/22/19 Blood glucose reading: 101 mg/dL  Patient instructed to monitor glucose levels: FBS: 60 - <95; 1 hour: <140; 2 hour: <120  Patient received handouts:  Nutrition Diabetes and Pregnancy, including carb counting list  Patient will be seen for follow-up as needed.

## 2018-08-20 ENCOUNTER — Other Ambulatory Visit: Payer: Self-pay | Admitting: Obstetrics & Gynecology

## 2018-08-29 ENCOUNTER — Inpatient Hospital Stay (HOSPITAL_COMMUNITY)
Admission: AD | Admit: 2018-08-29 | Discharge: 2018-08-29 | Disposition: A | Discharge status: Home / Self Care | Payer: Medicaid Other | Source: Ambulatory Visit | Attending: Obstetrics and Gynecology | Admitting: Obstetrics and Gynecology

## 2018-08-29 ENCOUNTER — Encounter (HOSPITAL_COMMUNITY): Payer: Self-pay

## 2018-08-29 ENCOUNTER — Other Ambulatory Visit: Payer: Self-pay

## 2018-08-29 DIAGNOSIS — O34219 Maternal care for unspecified type scar from previous cesarean delivery: Secondary | ICD-10-CM | POA: Insufficient documentation

## 2018-08-29 DIAGNOSIS — O133 Gestational [pregnancy-induced] hypertension without significant proteinuria, third trimester: Secondary | ICD-10-CM | POA: Diagnosis present

## 2018-08-29 DIAGNOSIS — Z3A36 36 weeks gestation of pregnancy: Secondary | ICD-10-CM | POA: Diagnosis not present

## 2018-08-29 LAB — CBC
HEMATOCRIT: 30.8 % — AB (ref 36.0–46.0)
Hemoglobin: 10.4 g/dL — ABNORMAL LOW (ref 12.0–15.0)
MCH: 26.1 pg (ref 26.0–34.0)
MCHC: 33.8 g/dL (ref 30.0–36.0)
MCV: 77.4 fL — AB (ref 80.0–100.0)
Platelets: 186 10*3/uL (ref 150–400)
RBC: 3.98 MIL/uL (ref 3.87–5.11)
RDW: 13.8 % (ref 11.5–15.5)
WBC: 7.4 10*3/uL (ref 4.0–10.5)
nRBC: 0 % (ref 0.0–0.2)

## 2018-08-29 LAB — COMPREHENSIVE METABOLIC PANEL
ALT: 14 U/L (ref 0–44)
ANION GAP: 10 (ref 5–15)
AST: 16 U/L (ref 15–41)
Albumin: 2.7 g/dL — ABNORMAL LOW (ref 3.5–5.0)
Alkaline Phosphatase: 121 U/L (ref 38–126)
BUN: 11 mg/dL (ref 6–20)
CHLORIDE: 103 mmol/L (ref 98–111)
CO2: 20 mmol/L — AB (ref 22–32)
Calcium: 8.9 mg/dL (ref 8.9–10.3)
Creatinine, Ser: 0.51 mg/dL (ref 0.44–1.00)
GFR calc non Af Amer: >60 mL/min (ref >60)
Glucose, Bld: 121 mg/dL — ABNORMAL HIGH (ref 70–99)
POTASSIUM: 3.7 mmol/L (ref 3.5–5.1)
SODIUM: 133 mmol/L — AB (ref 135–145)
Total Bilirubin: 0.4 mg/dL (ref 0.3–1.2)
Total Protein: 6.4 g/dL — ABNORMAL LOW (ref 6.5–8.1)

## 2018-08-29 LAB — URINALYSIS, ROUTINE W REFLEX MICROSCOPIC
BILIRUBIN URINE: NEGATIVE
GLUCOSE, UA: 50 mg/dL — AB
Hgb urine dipstick: NEGATIVE
KETONES UR: NEGATIVE mg/dL
LEUKOCYTES UA: NEGATIVE
NITRITE: NEGATIVE
PH: 5 (ref 5.0–8.0)
PROTEIN: NEGATIVE mg/dL
Specific Gravity, Urine: 1.021 (ref 1.005–1.030)

## 2018-08-29 LAB — PROTEIN / CREATININE RATIO, URINE
Creatinine, Urine: 132 mg/dL
Protein Creatinine Ratio: 0.18 mg/mg{Cre} — ABNORMAL HIGH (ref 0.00–0.15)
TOTAL PROTEIN, URINE: 24 mg/dL

## 2018-08-29 NOTE — MAU Note (Addendum)
Protein in urine was high today in office and b/p elevated. Was to come at 1500 but husband not home from work and had other children at home so just now able to come. Denies any pain. No b/p issues with other pregnancies. GDM on Metformin. Denies LOF or bleeding. Good FM. B/P 156/90 today in office and urine protein 1+

## 2018-08-29 NOTE — MAU Provider Note (Signed)
History     CSN: [id number]  Arrival date and time: 08/29/18 1931   None     Chief Complaint  Patient presents with  . Hypertension   HPI   Shannon Dennis is a 42 y.o. [phone] female at [redacted]w[redacted]d who presents to MAU for evaluation after her clinic visit today. Was told at her OB office that she had protein in her urine and slightly elevated BPs. Was instructed to come to MAU for lab work. Patient reports one prior time during this pregnancy that BP was elevated. Otherwise pregnancy complicated by GDM for which she takes an oral medication, unsure of the name. Denies prior history of HTN or Pre-Eclampsia. Has never taken medication for BP. Denies headaches, vision changes, RUQ pain and edema.    OB History    Gravida  4   Para  3   Term  3   Preterm      AB      Living  3     SAB      TAB      Ectopic      Multiple  0   Live Births  3           Past Medical History:  Diagnosis Date  . Gestational diabetes 2012    Past Surgical History:  Procedure Laterality Date  . BREAST SURGERY     right  . CESAREAN SECTION     one previous  . CESAREAN SECTION N/A 08/02/2015   Procedure: CESAREAN SECTION;  Surgeon: Hoover Browns, MD;  Location: [facility];  Service: Obstetrics;  Laterality: N/A;    Family History  Problem Relation Age of Onset  . Diabetes Mother   . Hypertension Mother   . Diabetes Father   . Hypertension Father     Social History   Tobacco Use  . Smoking status: Never Smoker  . Smokeless tobacco: Never Used  Substance Use Topics  . Alcohol use: No  . Drug use: No    Allergies: No Known Allergies  Medications Prior to Admission  Medication Sig Dispense Refill Last Dose  . Ondansetron HCl (ZOFRAN PO) Take by mouth.   Unknown at Unknown time  . Prenatal Vit-Fe Fumarate-FA (PRENATAL VITAMIN PO) Take by mouth.   Taking    Review of Systems  Constitutional: Negative for chills and fever.  Eyes: Negative for visual disturbance.  Respiratory:  Negative for shortness of breath.   Cardiovascular: Negative for chest pain.  Gastrointestinal: Negative for abdominal pain.  Neurological: Negative for light-headedness and headaches.   Physical Exam   Blood pressure (!) 145/89, pulse 77, temperature 98 F (36.7 C), resp. rate 20, height 5\' 2"  (1.575 m), weight 89.8 kg, unknown if currently breastfeeding.   Patient Vitals for the past 24 hrs:  BP Temp Pulse Resp Height Weight  08/29/18 2031 (!) 150/95 - 78 - - -  08/29/18 2017 (!) 145/89 - 77 - - -  08/29/18 2015 (!) 150/94 - 78 - - -  08/29/18 2000 135/90 - 78 - - -  08/29/18 1955 - 98 F (36.7 C) - 20 5\' 2"  (1.575 m) 89.8 kg    Physical Exam  Nursing note and vitals reviewed. Constitutional: She is oriented to person, place, and time. She appears well-developed and well-nourished. No distress.  HENT:  Head: Normocephalic and atraumatic.  Eyes: Conjunctivae and EOM are normal.  Neck: Normal range of motion. Neck supple.  Cardiovascular: Normal rate, regular rhythm and normal heart sounds.  No murmur heard. Respiratory: Effort normal and breath sounds normal. No respiratory distress.  GI: Soft. She exhibits no distension. There is no tenderness.  Gravid.   Musculoskeletal: Normal range of motion. She exhibits no edema.  Neurological: She is alert and oriented to person, place, and time.  Skin: Skin is warm and dry. She is not diaphoretic.  Psychiatric: She has a normal mood and affect. Her behavior is normal.   NST: 130 bpm, moderate variability,+acels, no decels  Toco: Occasional    MAU Course  Procedures  MDM Patient presenting to MAU with elevated BPs. PIH labs obtained. CMET and CBC unremarkable. UPC 0.18.    Assessment and Plan   1. Gestational hypertension, third trimester   2. Previous cesarean delivery affecting pregnancy    Patient now meets criteria for gestational HTN given previously elevated BP and consistently elevated BPs in MAU today, no severe  range pressures. Patient is asymptomatic. PIH labs are unremarkable. Warrants delivery at [redacted]w[redacted]d due to new onset gestational HTN. Given history of prior CS x3 will need to change scheduled date of CS. Discussed with on call midwife with practice who will coordinate with attending to schedule. Strict return precautions for Pre-E discussed with patient.   De Hollingshead 08/29/2018, 8:25 PM

## 2018-08-29 NOTE — Discharge Instructions (Signed)
You will need to have your C-section at 37 weeks due to the new diagnosis of gestational hypertension. We will get that scheduled and call you with more details. Please return sooner if you have a severe headache, shortness of breath, pain in the right upper area of your stomach or trouble breathing.

## 2018-08-31 ENCOUNTER — Encounter (HOSPITAL_COMMUNITY): Payer: Medicaid Other

## 2018-08-31 ENCOUNTER — Inpatient Hospital Stay (HOSPITAL_COMMUNITY)
Admission: AD | Admit: 2018-08-31 | Discharge: 2018-08-31 | Disposition: A | Discharge status: Home / Self Care | Payer: Medicaid Other | Source: Ambulatory Visit | Attending: Obstetrics and Gynecology | Admitting: Obstetrics and Gynecology

## 2018-08-31 DIAGNOSIS — O133 Gestational [pregnancy-induced] hypertension without significant proteinuria, third trimester: Secondary | ICD-10-CM | POA: Diagnosis not present

## 2018-08-31 DIAGNOSIS — Z3A37 37 weeks gestation of pregnancy: Secondary | ICD-10-CM | POA: Insufficient documentation

## 2018-08-31 DIAGNOSIS — Z8249 Family history of ischemic heart disease and other diseases of the circulatory system: Secondary | ICD-10-CM | POA: Diagnosis not present

## 2018-08-31 DIAGNOSIS — O163 Unspecified maternal hypertension, third trimester: Secondary | ICD-10-CM | POA: Diagnosis present

## 2018-08-31 NOTE — MAU Provider Note (Addendum)
Chief Complaint:  Hypertension   First Provider Initiated Contact with Patient 08/31/18 1506     HPI: Shannon Dennis is a 42 y.o. G4P3003 at [redacted]w[redacted]d who presents to maternity admissions reporting BP and NST check, pt has GHTN and needs 37 weeks repeat CS rescheduled from 10/21 to 10/22. Pt currently denies HA, vision changes or epigastric pain. Denies contractions, leakage of fluid or vaginal bleeding. Good fetal movement. Pt has a h/o x3 CS.   Pregnancy Course:   Past Medical History:  Diagnosis Date  . Gestational diabetes 2012   OB History  Gravida Para Term Preterm AB Living  4 3 3     3   SAB TAB Ectopic Multiple Live Births        0 3    # Outcome Date GA Lbr Len/2nd Weight Sex Delivery Anes PTL Lv  4 Current           3 Term 08/02/15 [redacted]w[redacted]d  3225 g M CS-Vac Spinal  LIV  2 Term      CS-LTranv   LIV  1 Term      CS-LTranv   LIV   Past Surgical History:  Procedure Laterality Date  . BREAST SURGERY     right  . CESAREAN SECTION     one previous  . CESAREAN SECTION N/A 08/02/2015   Procedure: CESAREAN SECTION;  Surgeon: Hoover Browns, MD;  Location: [facility];  Service: Obstetrics;  Laterality: N/A;   Family History  Problem Relation Age of Onset  . Diabetes Mother   . Hypertension Mother   . Diabetes Father   . Hypertension Father    Social History   Tobacco Use  . Smoking status: Never Smoker  . Smokeless tobacco: Never Used  Substance Use Topics  . Alcohol use: No  . Drug use: No   No Known Allergies No medications prior to admission.    I have reviewed patient's Past Medical Hx, Surgical Hx, Family Hx, Social Hx, medications and allergies.   ROS:  Review of Systems  All other systems reviewed and are negative.   Physical Exam   Patient Vitals for the past 24 hrs:  BP Temp Pulse Resp SpO2 Weight  08/31/18 1606 137/84 - 87 18 99 % -  08/31/18 1545 129/81 - 86 - - -  08/31/18 1530 (!) 143/88 - 93 - - -  08/31/18 1458 134/80 98.1 F (36.7 C) 86 18 - 90.9 kg    Constitutional: Well-developed, well-nourished female in no acute distress.  Cardiovascular: normal rate Respiratory: normal effort GI: Abd soft, non-tender, gravid appropriate for gestational age. Pos BS x 4 MS: Extremities nontender, no edema, normal ROM, 2+ patellar DTR, No clonus Neurologic: Alert and oriented x 4.  GU: Neg CVAT.    NST: FHR baseline 130 bpm, Variability: moderate, Accelerations:present, Decelerations:  Absent= Cat 1/Reactive UC:   none   Labs: No results found for this or any previous visit (from the past 24 hour(s)).  Imaging:  No results found.  MAU Course: Orders Placed This Encounter  Procedures  . Diet - low sodium heart healthy  . Increase activity slowly  . Call MD for:  . Call MD for:  temperature >100.4  . Call MD for:  persistant nausea and vomiting  . Call MD for:  severe uncontrolled pain  . Call MD for:  redness, tenderness, or signs of infection (pain, swelling, redness, odor or green/yellow discharge around incision site)  . Call MD for:  difficulty breathing,  headache or visual disturbances  . Call MD for:  hives  . Call MD for:  persistant dizziness or light-headedness  . Call MD for:  extreme fatigue  . (HEART FAILURE PATIENTS) Call MD:  Anytime you have any of the following symptoms: 1) 3 pound weight gain in 24 hours or 5 pounds in 1 week 2) shortness of breath, with or without a dry hacking cough 3) swelling in the hands, feet or stomach 4) if you have to sleep on extra pillows at night in order to breathe.  . Discharge patient Discharge disposition: 01-Home or Self Care; Discharge patient date: 08/31/2018   No orders of the defined types were placed in this encounter.   MDM: NST reactive, chart and BP reviewed, consistent with controlled GHTN, consulted with Dr Normand Sloop, discharge.   Assessment: 1. Gestational hypertension, third trimester   Pt @ [redacted] weeks gestation with GHTN controlled BP today of 137/84, stable, asymptomatic.   NST reactive.   Plan: Discharge home in stable condition.  Labor precautions and fetal kick counts Report s/sx of preeclampsia  Dr Normand Sloop to reschedule Repeat CS for 09/02/2018 Follow-up Information    [facility] [place] Follow up.   Why:  You will recieve a call about your scheduled c/s scheduled on 09/02/2018 Contact information: [address] [place] [address] [phone]          Allergies as of 08/31/2018   No Known Allergies     Medication List    TAKE these medications   PRENATAL VITAMIN PO Take by mouth.   ZOFRAN PO Take by mouth.       [facility] NP-C, CNM [place], [place], [place] 08/31/2018 4:30 PM

## 2018-08-31 NOTE — MAU Note (Signed)
BP was high in office the other, reports she was told to come here and have BP checked  No headache, no blurry vision, no epigastric pain  +FM

## 2018-08-31 NOTE — MAU Note (Signed)
Urine in lab 

## 2018-09-01 ENCOUNTER — Telehealth (HOSPITAL_COMMUNITY): Payer: Self-pay | Admitting: *Deleted

## 2018-09-01 ENCOUNTER — Encounter (HOSPITAL_COMMUNITY): Payer: Self-pay

## 2018-09-01 ENCOUNTER — Encounter (HOSPITAL_COMMUNITY)
Admission: RE | Admit: 2018-09-01 | Discharge: 2018-09-01 | Disposition: A | Discharge status: Home / Self Care | Payer: Medicaid Other | Source: Ambulatory Visit

## 2018-09-01 ENCOUNTER — Other Ambulatory Visit: Payer: Self-pay | Admitting: Obstetrics and Gynecology

## 2018-09-01 MED ORDER — CEFAZOLIN SODIUM-DEXTROSE 2-4 GM/100ML-% IV SOLN: 2.0000 g | INTRAVENOUS | Status: DC

## 2018-09-01 NOTE — Telephone Encounter (Signed)
Preadmission screen Message left for pt to arrive at Main Entrance at 0745 morning of 10/22.  NPO after MN. Reviewed medications via Epic and no medications needed prior to surgery.  Left my phone number for contact.  I will be in office until 1245.

## 2018-09-02 ENCOUNTER — Inpatient Hospital Stay (HOSPITAL_COMMUNITY): Payer: Medicaid Other | Admitting: Certified Registered Nurse Anesthetist

## 2018-09-02 ENCOUNTER — Other Ambulatory Visit: Payer: Self-pay

## 2018-09-02 ENCOUNTER — Encounter (HOSPITAL_COMMUNITY): Payer: Self-pay | Admitting: *Deleted

## 2018-09-02 ENCOUNTER — Encounter (HOSPITAL_COMMUNITY)
Admission: RE | Disposition: A | Discharge status: Home / Self Care | Payer: Self-pay | Source: Home / Self Care | Attending: Obstetrics and Gynecology

## 2018-09-02 ENCOUNTER — Inpatient Hospital Stay (HOSPITAL_COMMUNITY)
Admission: RE | Admit: 2018-09-02 | Discharge: 2018-09-07 | DRG: 788 | Disposition: A | Discharge status: Home / Self Care | Payer: Medicaid Other | Attending: Obstetrics and Gynecology | Admitting: Obstetrics and Gynecology

## 2018-09-02 DIAGNOSIS — O9081 Anemia of the puerperium: Secondary | ICD-10-CM | POA: Diagnosis not present

## 2018-09-02 DIAGNOSIS — O24425 Gestational diabetes mellitus in childbirth, controlled by oral hypoglycemic drugs: Secondary | ICD-10-CM | POA: Diagnosis present

## 2018-09-02 DIAGNOSIS — O24419 Gestational diabetes mellitus in pregnancy, unspecified control: Secondary | ICD-10-CM | POA: Diagnosis present

## 2018-09-02 DIAGNOSIS — O1494 Unspecified pre-eclampsia, complicating childbirth: Secondary | ICD-10-CM | POA: Diagnosis present

## 2018-09-02 DIAGNOSIS — L918 Other hypertrophic disorders of the skin: Secondary | ICD-10-CM | POA: Diagnosis present

## 2018-09-02 DIAGNOSIS — Z3A37 37 weeks gestation of pregnancy: Secondary | ICD-10-CM

## 2018-09-02 DIAGNOSIS — O134 Gestational [pregnancy-induced] hypertension without significant proteinuria, complicating childbirth: Secondary | ICD-10-CM | POA: Diagnosis present

## 2018-09-02 DIAGNOSIS — O34211 Maternal care for low transverse scar from previous cesarean delivery: Principal | ICD-10-CM | POA: Diagnosis present

## 2018-09-02 DIAGNOSIS — O99019 Anemia complicating pregnancy, unspecified trimester: Secondary | ICD-10-CM | POA: Diagnosis present

## 2018-09-02 LAB — CBC
HCT: 31.4 % — ABNORMAL LOW (ref 36.0–46.0)
HEMOGLOBIN: 10.5 g/dL — AB (ref 12.0–15.0)
MCH: 25.6 pg — AB (ref 26.0–34.0)
MCHC: 33.4 g/dL (ref 30.0–36.0)
MCV: 76.6 fL — ABNORMAL LOW (ref 80.0–100.0)
Platelets: 216 10*3/uL (ref 150–400)
RBC: 4.1 MIL/uL (ref 3.87–5.11)
RDW: 14.3 % (ref 11.5–15.5)
WBC: 6.8 10*3/uL (ref 4.0–10.5)

## 2018-09-02 LAB — CREATININE, SERUM
Creatinine, Ser: 0.57 mg/dL (ref 0.44–1.00)
GFR calc Af Amer: >60 mL/min (ref >60)
GFR calc non Af Amer: >60 mL/min (ref >60)

## 2018-09-02 LAB — TYPE AND SCREEN
ABO/RH(D): B POS
ANTIBODY SCREEN: NEGATIVE

## 2018-09-02 LAB — GLUCOSE, CAPILLARY: Glucose-Capillary: 102 mg/dL — ABNORMAL HIGH (ref 70–99)

## 2018-09-02 SURGERY — Surgical Case
Anesthesia: Spinal | Wound class: Clean Contaminated

## 2018-09-02 MED ORDER — SILVER NITRATE-POT NITRATE 75-25 % EX MISC
CUTANEOUS | Status: DC | PRN
  Administered 2018-09-02: 1

## 2018-09-02 MED ORDER — SODIUM CHLORIDE 0.9% FLUSH: 3.0000 mL | INTRAVENOUS | Status: DC | PRN

## 2018-09-02 MED ORDER — BUPIVACAINE HCL (PF) 0.25 % IJ SOLN
INTRAMUSCULAR | Status: AC
  Filled 2018-09-02: qty 30

## 2018-09-02 MED ORDER — OXYTOCIN 10 UNIT/ML IJ SOLN
INTRAMUSCULAR | Status: AC
  Filled 2018-09-02: qty 4

## 2018-09-02 MED ORDER — KETOROLAC TROMETHAMINE 30 MG/ML IJ SOLN
INTRAMUSCULAR | Status: AC
  Filled 2018-09-02: qty 1

## 2018-09-02 MED ORDER — NALBUPHINE HCL 10 MG/ML IJ SOLN: 5.0000 mg | Freq: Once | INTRAMUSCULAR | Status: AC | PRN

## 2018-09-02 MED ORDER — LACTATED RINGERS IV SOLN
INTRAVENOUS | Status: DC | PRN
  Administered 2018-09-02: 11:00:00 via INTRAVENOUS

## 2018-09-02 MED ORDER — PHENYLEPHRINE 8 MG IN D5W 100 ML (0.08MG/ML) PREMIX OPTIME
INJECTION | INTRAVENOUS | Status: AC
  Filled 2018-09-02: qty 100

## 2018-09-02 MED ORDER — SENNOSIDES-DOCUSATE SODIUM 8.6-50 MG PO TABS
2.0000 | ORAL_TABLET | ORAL | Status: DC
  Administered 2018-09-02 – 2018-09-06 (×5): 2 via ORAL
  Filled 2018-09-02 (×5): qty 2

## 2018-09-02 MED ORDER — METHYLERGONOVINE MALEATE 0.2 MG PO TABS: 0.2000 mg | ORAL_TABLET | ORAL | Status: DC | PRN

## 2018-09-02 MED ORDER — FENTANYL CITRATE (PF) 100 MCG/2ML IJ SOLN
INTRAMUSCULAR | Status: DC | PRN
  Administered 2018-09-02: 15 ug via INTRATHECAL

## 2018-09-02 MED ORDER — ENOXAPARIN SODIUM 40 MG/0.4ML ~~LOC~~ SOLN
40.0000 mg | SUBCUTANEOUS | Status: DC
  Administered 2018-09-03 – 2018-09-07 (×5): 40 mg via SUBCUTANEOUS
  Filled 2018-09-02 (×5): qty 0.4

## 2018-09-02 MED ORDER — DIPHENHYDRAMINE HCL 50 MG/ML IJ SOLN: 12.5000 mg | INTRAMUSCULAR | Status: DC | PRN

## 2018-09-02 MED ORDER — OXYTOCIN 10 UNIT/ML IJ SOLN
INTRAVENOUS | Status: DC | PRN
  Administered 2018-09-02: 40 [IU] via INTRAVENOUS

## 2018-09-02 MED ORDER — TETANUS-DIPHTH-ACELL PERTUSSIS 5-2.5-18.5 LF-MCG/0.5 IM SUSP: 0.5000 mL | Freq: Once | INTRAMUSCULAR | Status: DC

## 2018-09-02 MED ORDER — PHENYLEPHRINE 8 MG IN D5W 100 ML (0.08MG/ML) PREMIX OPTIME
INJECTION | INTRAVENOUS | Status: DC | PRN
  Administered 2018-09-02: 60 ug/min via INTRAVENOUS

## 2018-09-02 MED ORDER — PHENYLEPHRINE 40 MCG/ML (10ML) SYRINGE FOR IV PUSH (FOR BLOOD PRESSURE SUPPORT)
PREFILLED_SYRINGE | INTRAVENOUS | Status: DC | PRN
  Administered 2018-09-02: 40 ug via INTRAVENOUS

## 2018-09-02 MED ORDER — OXYCODONE-ACETAMINOPHEN 5-325 MG PO TABS: 2.0000 | ORAL_TABLET | ORAL | Status: DC | PRN

## 2018-09-02 MED ORDER — LACTATED RINGERS IV SOLN
INTRAVENOUS | Status: DC | PRN
  Administered 2018-09-02: 10:00:00 via INTRAVENOUS

## 2018-09-02 MED ORDER — SODIUM CHLORIDE 0.9 % IV SOLN: INTRAVENOUS | Status: DC

## 2018-09-02 MED ORDER — COCONUT OIL OIL: 1.0000 "application " | TOPICAL_OIL | Status: DC | PRN

## 2018-09-02 MED ORDER — NALBUPHINE HCL 10 MG/ML IJ SOLN
5.0000 mg | Freq: Once | INTRAMUSCULAR | Status: AC | PRN
  Administered 2018-09-02: 5 mg via SUBCUTANEOUS

## 2018-09-02 MED ORDER — BUPIVACAINE IN DEXTROSE 0.75-8.25 % IT SOLN
INTRATHECAL | Status: DC | PRN
  Administered 2018-09-02: 1.5 mL via INTRATHECAL

## 2018-09-02 MED ORDER — MORPHINE SULFATE (PF) 0.5 MG/ML IJ SOLN
INTRAMUSCULAR | Status: DC | PRN
  Administered 2018-09-02: .15 mg via INTRATHECAL

## 2018-09-02 MED ORDER — KETOROLAC TROMETHAMINE 30 MG/ML IJ SOLN
30.0000 mg | Freq: Four times a day (QID) | INTRAMUSCULAR | Status: AC | PRN
  Administered 2018-09-02: 30 mg via INTRAMUSCULAR

## 2018-09-02 MED ORDER — ONDANSETRON HCL 4 MG/2ML IJ SOLN: 4.0000 mg | Freq: Three times a day (TID) | INTRAMUSCULAR | Status: DC | PRN

## 2018-09-02 MED ORDER — ACETAMINOPHEN 325 MG PO TABS
650.0000 mg | ORAL_TABLET | ORAL | Status: DC | PRN
  Administered 2018-09-03: 650 mg via ORAL
  Filled 2018-09-02: qty 2

## 2018-09-02 MED ORDER — SCOPOLAMINE 1 MG/3DAYS TD PT72
1.0000 | MEDICATED_PATCH | TRANSDERMAL | Status: DC
  Administered 2018-09-02: 1.5 mg via TRANSDERMAL

## 2018-09-02 MED ORDER — MENTHOL 3 MG MT LOZG: 1.0000 | LOZENGE | OROMUCOSAL | Status: DC | PRN

## 2018-09-02 MED ORDER — FENTANYL CITRATE (PF) 100 MCG/2ML IJ SOLN
25.0000 ug | INTRAMUSCULAR | Status: DC | PRN
  Administered 2018-09-02: 25 ug via INTRAVENOUS

## 2018-09-02 MED ORDER — SODIUM CHLORIDE 0.9 % IR SOLN
Status: DC | PRN
  Administered 2018-09-02: 1000 mL

## 2018-09-02 MED ORDER — SCOPOLAMINE 1 MG/3DAYS TD PT72
MEDICATED_PATCH | TRANSDERMAL | Status: AC
  Filled 2018-09-02: qty 1

## 2018-09-02 MED ORDER — CEFAZOLIN SODIUM-DEXTROSE 2-3 GM-%(50ML) IV SOLR
INTRAVENOUS | Status: DC | PRN
  Administered 2018-09-02: 2 g via INTRAVENOUS

## 2018-09-02 MED ORDER — NALOXONE HCL 4 MG/10ML IJ SOLN
1.0000 ug/kg/h | INTRAVENOUS | Status: DC | PRN
  Filled 2018-09-02: qty 5

## 2018-09-02 MED ORDER — MORPHINE SULFATE (PF) 0.5 MG/ML IJ SOLN
INTRAMUSCULAR | Status: AC
  Filled 2018-09-02: qty 10

## 2018-09-02 MED ORDER — NALBUPHINE HCL 10 MG/ML IJ SOLN
INTRAMUSCULAR | Status: AC
  Filled 2018-09-02: qty 1

## 2018-09-02 MED ORDER — FENTANYL CITRATE (PF) 100 MCG/2ML IJ SOLN
INTRAMUSCULAR | Status: AC
  Filled 2018-09-02: qty 2

## 2018-09-02 MED ORDER — SOD CITRATE-CITRIC ACID 500-334 MG/5ML PO SOLN
30.0000 mL | Freq: Once | ORAL | Status: AC
  Administered 2018-09-02: 30 mL via ORAL

## 2018-09-02 MED ORDER — KETOROLAC TROMETHAMINE 30 MG/ML IJ SOLN: 30.0000 mg | Freq: Four times a day (QID) | INTRAMUSCULAR | Status: AC | PRN

## 2018-09-02 MED ORDER — ZOLPIDEM TARTRATE 5 MG PO TABS: 5.0000 mg | ORAL_TABLET | Freq: Every evening | ORAL | Status: DC | PRN

## 2018-09-02 MED ORDER — SILVER NITRATE-POT NITRATE 75-25 % EX MISC
CUTANEOUS | Status: AC
  Filled 2018-09-02: qty 1

## 2018-09-02 MED ORDER — SOD CITRATE-CITRIC ACID 500-334 MG/5ML PO SOLN
ORAL | Status: AC
  Filled 2018-09-02: qty 15

## 2018-09-02 MED ORDER — SCOPOLAMINE 1 MG/3DAYS TD PT72: 1.0000 | MEDICATED_PATCH | Freq: Once | TRANSDERMAL | Status: DC

## 2018-09-02 MED ORDER — MEPERIDINE HCL 25 MG/ML IJ SOLN: 6.2500 mg | INTRAMUSCULAR | Status: DC | PRN

## 2018-09-02 MED ORDER — LACTATED RINGERS IV SOLN
INTRAVENOUS | Status: DC
  Administered 2018-09-02 – 2018-09-03 (×2): via INTRAVENOUS

## 2018-09-02 MED ORDER — SIMETHICONE 80 MG PO CHEW
80.0000 mg | CHEWABLE_TABLET | ORAL | Status: DC
  Administered 2018-09-02 – 2018-09-05 (×4): 80 mg via ORAL
  Filled 2018-09-02 (×5): qty 1

## 2018-09-02 MED ORDER — DIPHENHYDRAMINE HCL 25 MG PO CAPS: 25.0000 mg | ORAL_CAPSULE | Freq: Four times a day (QID) | ORAL | Status: DC | PRN

## 2018-09-02 MED ORDER — ONDANSETRON HCL 4 MG/2ML IJ SOLN
INTRAMUSCULAR | Status: AC
  Filled 2018-09-02: qty 2

## 2018-09-02 MED ORDER — METOCLOPRAMIDE HCL 5 MG/ML IJ SOLN: 10.0000 mg | Freq: Once | INTRAMUSCULAR | Status: DC | PRN

## 2018-09-02 MED ORDER — LACTATED RINGERS IV SOLN: INTRAVENOUS | Status: DC

## 2018-09-02 MED ORDER — METHYLERGONOVINE MALEATE 0.2 MG/ML IJ SOLN: 0.2000 mg | INTRAMUSCULAR | Status: DC | PRN

## 2018-09-02 MED ORDER — ONDANSETRON HCL 4 MG/2ML IJ SOLN
INTRAMUSCULAR | Status: DC | PRN
  Administered 2018-09-02: 4 mg via INTRAVENOUS

## 2018-09-02 MED ORDER — NALBUPHINE HCL 10 MG/ML IJ SOLN
5.0000 mg | INTRAMUSCULAR | Status: DC | PRN
  Administered 2018-09-02: 5 mg via SUBCUTANEOUS
  Filled 2018-09-02: qty 1

## 2018-09-02 MED ORDER — ACETAMINOPHEN 500 MG PO TABS
1000.0000 mg | ORAL_TABLET | Freq: Four times a day (QID) | ORAL | Status: AC
  Administered 2018-09-02 – 2018-09-03 (×4): 1000 mg via ORAL
  Filled 2018-09-02 (×4): qty 2

## 2018-09-02 MED ORDER — DIPHENHYDRAMINE HCL 25 MG PO CAPS
25.0000 mg | ORAL_CAPSULE | ORAL | Status: DC | PRN
  Administered 2018-09-02 (×2): 25 mg via ORAL
  Filled 2018-09-02 (×3): qty 1

## 2018-09-02 MED ORDER — IBUPROFEN 600 MG PO TABS
600.0000 mg | ORAL_TABLET | Freq: Four times a day (QID) | ORAL | Status: DC
  Administered 2018-09-02 – 2018-09-07 (×19): 600 mg via ORAL
  Filled 2018-09-02 (×19): qty 1

## 2018-09-02 MED ORDER — DIBUCAINE 1 % RE OINT: 1.0000 "application " | TOPICAL_OINTMENT | RECTAL | Status: DC | PRN

## 2018-09-02 MED ORDER — WITCH HAZEL-GLYCERIN EX PADS: 1.0000 "application " | MEDICATED_PAD | CUTANEOUS | Status: DC | PRN

## 2018-09-02 MED ORDER — FERROUS SULFATE 325 (65 FE) MG PO TABS
325.0000 mg | ORAL_TABLET | Freq: Two times a day (BID) | ORAL | Status: DC
  Administered 2018-09-03: 325 mg via ORAL
  Filled 2018-09-02: qty 1

## 2018-09-02 MED ORDER — STERILE WATER FOR IRRIGATION IR SOLN
Status: DC | PRN
  Administered 2018-09-02: 1000 mL

## 2018-09-02 MED ORDER — OXYCODONE-ACETAMINOPHEN 5-325 MG PO TABS: 1.0000 | ORAL_TABLET | ORAL | Status: DC | PRN

## 2018-09-02 MED ORDER — SIMETHICONE 80 MG PO CHEW
80.0000 mg | CHEWABLE_TABLET | ORAL | Status: DC | PRN
  Administered 2018-09-04 – 2018-09-06 (×2): 80 mg via ORAL

## 2018-09-02 MED ORDER — BUPIVACAINE HCL (PF) 0.25 % IJ SOLN
INTRAMUSCULAR | Status: DC | PRN
  Administered 2018-09-02: 20 mL

## 2018-09-02 MED ORDER — MEASLES, MUMPS & RUBELLA VAC ~~LOC~~ INJ
0.5000 mL | INJECTION | Freq: Once | SUBCUTANEOUS | Status: DC
  Filled 2018-09-02: qty 0.5

## 2018-09-02 MED ORDER — OXYTOCIN 40 UNITS IN LACTATED RINGERS INFUSION - SIMPLE MED: 2.5000 [IU]/h | INTRAVENOUS | Status: AC

## 2018-09-02 MED ORDER — NALOXONE HCL 0.4 MG/ML IJ SOLN: 0.4000 mg | INTRAMUSCULAR | Status: DC | PRN

## 2018-09-02 MED ORDER — SIMETHICONE 80 MG PO CHEW
80.0000 mg | CHEWABLE_TABLET | Freq: Three times a day (TID) | ORAL | Status: DC
  Administered 2018-09-02 – 2018-09-07 (×11): 80 mg via ORAL
  Filled 2018-09-02 (×13): qty 1

## 2018-09-02 MED ORDER — PRENATAL MULTIVITAMIN CH
1.0000 | ORAL_TABLET | Freq: Every day | ORAL | Status: DC
  Administered 2018-09-03 – 2018-09-06 (×4): 1 via ORAL
  Filled 2018-09-02 (×4): qty 1

## 2018-09-02 MED ORDER — NALBUPHINE HCL 10 MG/ML IJ SOLN: 5.0000 mg | INTRAMUSCULAR | Status: DC | PRN

## 2018-09-02 SURGICAL SUPPLY — 39 items
BANDAID FLEXIBLE 1X3 (GAUZE/BANDAGES/DRESSINGS) ×4 IMPLANT
BARRIER ADHS 3X4 INTERCEED (GAUZE/BANDAGES/DRESSINGS) ×2 IMPLANT
BENZOIN TINCTURE PRP APPL 2/3 (GAUZE/BANDAGES/DRESSINGS) ×2 IMPLANT
BOOTIES KNEE HIGH SLOAN (MISCELLANEOUS) ×4 IMPLANT
CHLORAPREP W/TINT 26ML (MISCELLANEOUS) ×2 IMPLANT
CLAMP CORD UMBIL (MISCELLANEOUS) IMPLANT
CLOTH BEACON ORANGE TIMEOUT ST (SAFETY) ×2 IMPLANT
DRAIN JACKSON PRT FLT 10 (DRAIN) IMPLANT
DRSG OPSITE POSTOP 4X10 (GAUZE/BANDAGES/DRESSINGS) ×2 IMPLANT
ELECT REM PT RETURN 9FT ADLT (ELECTROSURGICAL) ×2
ELECTRODE REM PT RTRN 9FT ADLT (ELECTROSURGICAL) ×1 IMPLANT
EVACUATOR SILICONE 100CC (DRAIN) IMPLANT
EXTRACTOR VACUUM M CUP 4 TUBE (SUCTIONS) IMPLANT
GAUZE SPONGE 4X4 12PLY STRL LF (GAUZE/BANDAGES/DRESSINGS) ×2 IMPLANT
GLOVE BIOGEL PI IND STRL 7.0 (GLOVE) ×2 IMPLANT
GLOVE BIOGEL PI INDICATOR 7.0 (GLOVE) ×2
GLOVE ECLIPSE 6.5 STRL STRAW (GLOVE) ×4 IMPLANT
GOWN STRL REUS W/TWL LRG LVL3 (GOWN DISPOSABLE) ×4 IMPLANT
KIT ABG SYR 3ML LUER SLIP (SYRINGE) IMPLANT
NEEDLE HYPO 22GX1.5 SAFETY (NEEDLE) ×2 IMPLANT
NEEDLE HYPO 25X5/8 SAFETYGLIDE (NEEDLE) IMPLANT
NS IRRIG 1000ML POUR BTL (IV SOLUTION) ×4 IMPLANT
PACK C SECTION WH (CUSTOM PROCEDURE TRAY) ×2 IMPLANT
PAD OB MATERNITY 4.3X12.25 (PERSONAL CARE ITEMS) ×2 IMPLANT
PENCIL SMOKE EVAC W/HOLSTER (ELECTROSURGICAL) ×2 IMPLANT
RTRCTR C-SECT PINK 25CM LRG (MISCELLANEOUS) ×2 IMPLANT
SPONGE LAP 18X18 RF (DISPOSABLE) ×8 IMPLANT
STRIP CLOSURE SKIN 1/2X4 (GAUZE/BANDAGES/DRESSINGS) ×2 IMPLANT
SUT MNCRL AB 3-0 PS2 27 (SUTURE) ×2 IMPLANT
SUT PLAIN 2 0 XLH (SUTURE) ×2 IMPLANT
SUT SILK 2 0 FSL 18 (SUTURE) IMPLANT
SUT VIC AB 0 CTX 36 (SUTURE) ×4
SUT VIC AB 0 CTX36XBRD ANBCTRL (SUTURE) ×4 IMPLANT
SUT VIC AB 1 CT1 36 (SUTURE) ×4 IMPLANT
SUT VIC AB 2-0 CT1 27 (SUTURE)
SUT VIC AB 2-0 CT1 TAPERPNT 27 (SUTURE) IMPLANT
SYR 20CC LL (SYRINGE) ×2 IMPLANT
TOWEL OR 17X24 6PK STRL BLUE (TOWEL DISPOSABLE) ×2 IMPLANT
TRAY FOLEY W/BAG SLVR 14FR LF (SET/KITS/TRAYS/PACK) ×2 IMPLANT

## 2018-09-02 NOTE — Transfer of Care (Signed)
Immediate Anesthesia Transfer of Care Note  Patient: Shannon Dennis  Procedure(s) Performed: REPEAT CESAREAN SECTION (N/A )  Patient Location: PACU  Anesthesia Type:Spinal  Level of Consciousness: awake, alert  and oriented  Airway & Oxygen Therapy: Patient Spontanous Breathing  Post-op Assessment: Report given to RN and Post -op Vital signs reviewed and stable  Post vital signs: Reviewed and stable  Last Vitals:  Vitals Value Taken Time  BP 126/73 09/02/2018 11:40 AM  Temp    Pulse 74 09/02/2018 11:41 AM  Resp    SpO2 99 % 09/02/2018 11:41 AM  Vitals shown include unvalidated device data.  Last Pain:  Vitals:   09/02/18 0825  TempSrc: Oral  PainSc: 0-No pain      Patients Stated Pain Goal: 6 (09/02/18 0825)  Complications: No apparent anesthesia complications

## 2018-09-02 NOTE — Anesthesia Preprocedure Evaluation (Signed)
Anesthesia Evaluation  Patient identified by MRN, date of birth, ID band Patient awake    Reviewed: Allergy & Precautions, NPO status , Patient's Chart, lab work & pertinent test results  Airway Mallampati: II  TM Distance: >3 FB Neck ROM: Full    Dental no notable dental hx.    Pulmonary neg pulmonary ROS,    Pulmonary exam normal breath sounds clear to auscultation       Cardiovascular negative cardio ROS Normal cardiovascular exam Rhythm:Regular Rate:Normal     Neuro/Psych negative neurological ROS  negative psych ROS   GI/Hepatic negative GI ROS, Neg liver ROS,   Endo/Other  diabetes, Gestational  Renal/GU negative Renal ROS  negative genitourinary   Musculoskeletal negative musculoskeletal ROS (+)   Abdominal   Peds negative pediatric ROS (+)  Hematology negative hematology ROS (+)   Anesthesia Other Findings   Reproductive/Obstetrics (+) Pregnancy                             Anesthesia Physical Anesthesia Plan  ASA: II  Anesthesia Plan: Spinal   Post-op Pain Management:    Induction:   PONV Risk Score and Plan: 3 and Ondansetron, Scopolamine patch - Pre-op and Treatment may vary due to age or medical condition  Airway Management Planned: Natural Airway  Additional Equipment:   Intra-op Plan:   Post-operative Plan:   Informed Consent: I have reviewed the patients History and Physical, chart, labs and discussed the procedure including the risks, benefits and alternatives for the proposed anesthesia with the patient or authorized representative who has indicated his/her understanding and acceptance.   Dental advisory given  Plan Discussed with: CRNA  Anesthesia Plan Comments:         Anesthesia Quick Evaluation

## 2018-09-02 NOTE — Plan of Care (Signed)
Pts. Condition will continue to improve 

## 2018-09-02 NOTE — Op Note (Signed)
Preoperative diagnosis: Intrauterine pregnancy at 37 weeks and 1 days, 3 previous cesarean section, gestational hypertension with proteinuria, Class A2 diabetes, bilateral thigh skin tags  Post operative diagnosis: Same and dense severe pelvic adhesions  Anesthesia: Spinal  Anesthesiologist: Dr. Acey Lav  Procedure: Repeat low transverse cesarean section with removal of 2 skin tags  Surgeon: Dr. Dois Davenport Minal Stuller  Assistant: Bernerd Pho CNM  Quantitated blood loss: 371 cc  Procedure:  After being informed of the planned procedure and possible complications including bleeding, infection, injury to other organs, informed consent is obtained. The patient is taken to OR #9 and given spinal anesthesia without complication. She is placed in the dorsal decubitus position with the pelvis tilted to the left. She is then prepped and draped in a sterile fashion. A Foley catheter is inserted in her bladder.  After assessing adequate level of anesthesia, we infiltrate the suprapubic area with 20 cc of Marcaine 0.25 and perform a Pfannenstiel incision which is brought down sharply to the fascia. The fascia is entered in a low transverse fashion. Linea alba is dissected with caution and great difficulty.We eventually obtain a peritoneal window which allows [place] to continue freeing the linea alba safely. Omental adhesions are dissected with double tying and cutting.The uterus is found to be densely adherent to the abdominal wall. Using knife, we shave the uterine serosa off the abdomina wall. It is impossible to dissect the linea alba downward due to thick adhesions and a high bladder limit. We succeed in placing the Alexis retractor in the pelvis which gently retracts the bladder down and allows a clear view of the lower uterine segment.   The myometrium is then entered in a low transverse fashion, 2 cm above the vesico-uterine junction ; first with knife and then extended bluntly. Amniotic fluid is clear. We  assist the birth of a female  infant in vertex presentation. Mouth and nose are suctioned. The baby is delivered. The cord is clamped and sectioned. The baby is given to the neonatologist present in the room.  10 cc of blood is drawn from the umbilical vein.The placenta is allowed to deliver spontaneously. It is complete and the cord has 3 vessels. Uterine revision is negative.  We proceed with closure of the myometrium in 2 layers: First with a running locked suture of 0 Vicryl, then with a Lembert suture of 0 Vicryl imbricating the first one. Hemostasis is completed with multiple figure-of-eight sutures of 0 Vicryl to control oozing from the shaved serosa and cauterization.  Both paracolic gutters are cleaned. Both tubes and ovaries are assessed and normal. The pelvis is profusely irrigated with warm saline to confirm a satisfactory hemostasis. A sheet of Interceed is placed on the uterine incision and covers all denuded serosa.  Retractors and sponges are removed. Under fascia hemostasis is completed with cauterization. The fascia is then closed with 2 running sutures of 0 Vicryl meeting midline. The wound is irrigated with warm saline and hemostasis is completed with cauterization. The subcuticular layer is closed with interrupted sutures of 0 Plain. The skin is closed with a subcuticular suture of 3-0 Monocryl and Steri-Strips.  Instrument and sponge count is complete x2. Quantitated blood loss is 371 cc. The wound is covered with a honeycomb dressing.  The patient is repositionned in frog leg position for removal of 2 skin tags on both inner thighs: 3 cm on the left and 2 cm on the right. Skin is cleansed with alcohol. Both tags are removed sharply with scissors  at their base. Hemostasis is achieved with Silver Nitrate. Sites are covered with bandaids.  The procedure is well tolerated by the patient who is taken to recovery room in a well and stable condition.  female baby named Yamen? was born at  10:45 and received an Apgar of 8  at 1 minute and 8 at 5 minutes.    Specimen: Placenta sent to L & D, 2 skin tags to pathology   Crist Fat Hargun Spurling MD 10/22/20199:45 AM

## 2018-09-02 NOTE — Progress Notes (Signed)
Pt unable to ambulate to BR. Pt feeling dizzy while standing. Pt stood at bedside for approx 5 min without complication.Pads changed and perineum cleaned while in bed. New bandaids applied on skintag sites.

## 2018-09-02 NOTE — Anesthesia Postprocedure Evaluation (Signed)
Anesthesia Post Note  Patient: Shannon Dennis  Procedure(s) Performed: REPEAT CESAREAN SECTION (N/A )     Patient location during evaluation: Mother Baby Anesthesia Type: Spinal Level of consciousness: awake and alert Pain management: pain level controlled Vital Signs Assessment: post-procedure vital signs reviewed and stable Respiratory status: spontaneous breathing, nonlabored ventilation and respiratory function stable Cardiovascular status: stable Postop Assessment: no headache, no backache, spinal receding, adequate PO intake, no apparent nausea or vomiting and patient able to bend at knees Anesthetic complications: no    Last Vitals:  Vitals:   09/02/18 1430 09/02/18 1600  BP: (!) 141/87 120/74  Pulse: 77 80  Resp: 16 16  Temp: 36.6 C (!) 36.3 C  SpO2: 98% 96%    Last Pain:  Vitals:   09/02/18 1600  TempSrc: Oral  PainSc: 7    Pain Goal: Patients Stated Pain Goal: 6 (09/02/18 0825)               Laban Emperor

## 2018-09-02 NOTE — Plan of Care (Signed)
Patient is doing well since her c/s, complaints have been for itching, dizziness and moderate pain.  She is now eating and appears better than when she first came to [facility]

## 2018-09-02 NOTE — Anesthesia Procedure Notes (Signed)
Spinal  Patient location during procedure: OR Start time: 09/02/2018 9:50 AM End time: 09/02/2018 9:55 AM Staffing Anesthesiologist: Phillips Grout, MD Resident/CRNA: Elgie Congo, CRNA Other anesthesia staff: Macie Burows, RN Performed: other anesthesia staff  Preanesthetic Checklist Completed: patient identified, site marked, surgical consent, pre-op evaluation, timeout performed, IV checked, risks and benefits discussed and monitors and equipment checked Spinal Block Patient position: sitting Prep: ChloraPrep Patient monitoring: heart rate, continuous pulse ox and blood pressure Approach: midline Location: L4-5 Injection technique: single-shot Needle Needle type: Pencan  Needle gauge: 24 G Needle length: 10 cm Assessment Sensory level: T6

## 2018-09-02 NOTE — H&P (Signed)
Shannon Dennis is a 42 y.o. female, G4P3003 at 37.2 weeks, presenting for repeat LTCS.Prenatal hx prior history of LTCS x3.   AMA, GDMA2 on glyburide 2.5, abnormal genetic testing with normal mfm [place]. Declined amnio.  Patient Active Problem List   Diagnosis Date Noted  . GDM (gestational diabetes mellitus) 09/02/2018  . Abnormal glucose tolerance test (GTT) during pregnancy, antepartum 07/23/2018  . Abnormal finding on antenatal screen   . [redacted] weeks gestation of pregnancy   . Cesarean delivery delivered 08/02/2015  . Gestational diabetes--diet controlled 07/28/2015  . Previous cesarean section x 2 07/28/2015  . AMA (advanced maternal age) multigravida 35+ 07/28/2015  . 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   EDC of 09/21/2018 was established by LMP.   Anatomy scan:  20 weeks, with normal findings and an posterior placenta.   Additional [place] evaluations:  For growth q 4 weeks. Significant prenatal events:  Abnormal panorama test normal [place] with MFM.  Fetal echo wnl Last evaluation: This week  OB History    Gravida  4   Para  3   Term  3   Preterm      AB      Living  3     SAB      TAB      Ectopic      Multiple  0   Live Births  3          Past Medical History:  Diagnosis Date  . Gestational diabetes 2012   Past Surgical History:  Procedure Laterality Date  . BREAST SURGERY     right  . CESAREAN SECTION     one previous  . CESAREAN SECTION N/A 08/02/2015   Procedure: CESAREAN SECTION;  Surgeon: Hoover Browns, MD;  Location: [facility];  Service: Obstetrics;  Laterality: N/A;  . INCISION AND DRAINAGE BREAST ABSCESS     left   Family History: family history includes Diabetes in her father and mother; Hypertension in her father and mother. Social History:  reports that she has never smoked. She has never used smokeless tobacco. She reports that she does not drink alcohol or use  drugs.   Prenatal Transfer Tool  Maternal Diabetes: GDMA2 on glyburide Genetic Screening: Abnormal:  Results: Other:triploidy Maternal Ultrasounds/Referrals: Normal Fetal Ultrasounds or other Referrals:  Fetal echo, Referred to Materal Fetal Medicine  Maternal Substance Abuse:  No Significant Maternal Medications:  None Significant Maternal Lab Results: None  TDAP UTD Flu   ROS:  All 10 symptoms reviewed and negative except as stated above  No Known Allergies     Blood pressure (!) 148/84, pulse 88, temperature 98.2 F (36.8 C), temperature source Oral, resp. rate 20, height 5\' 3"  (1.6 m), weight 88.7 kg, unknown if currently breastfeeding.  Chest clear Heart RRR without murmur Abd gravid, NT, FH appropriate Pelvic: deferred Ext:   FHR: 135  UCs:  None  Prenatal labs: ABO, Rh: B/Positive/-- (04/16 0000) Antibody: n (04/16 0000) Rubella:  Immune (04/16 0000) RPR: Nonreactive (04/16 0000)  HBsAg: Negative (04/16 0000)  HIV: Non-reactive (04/16 0000)  GBS:  Neg Sickle cell/Hgb electrophoresis:  AA Pap:  2016 neg GC:  Neg Chlamydia:  Neg Genetic screenings:  Abnormal panorama Glucola:  Elevated one hour and 3 hour Other:   Hgb 12.9  at NOB, 11.0 at 28 weeks       Assessment/Plan: IUP at 37.2 IUP for repeat LTCS  Plan: Admit to [facility] Routine [facility] orders for repeat low transverse cesarean section.   Kenney Houseman CNM, MSN 09/02/2018, 8:57 AM

## 2018-09-03 ENCOUNTER — Encounter (HOSPITAL_COMMUNITY): Payer: Self-pay | Admitting: Obstetrics and Gynecology

## 2018-09-03 LAB — COMPREHENSIVE METABOLIC PANEL
ALK PHOS: 97 U/L (ref 38–126)
ALT: 11 U/L (ref 0–44)
AST: 19 U/L (ref 15–41)
Albumin: 2.3 g/dL — ABNORMAL LOW (ref 3.5–5.0)
Anion gap: 7 (ref 5–15)
BUN: 7 mg/dL (ref 6–20)
CALCIUM: 8.4 mg/dL — AB (ref 8.9–10.3)
CHLORIDE: 104 mmol/L (ref 98–111)
CO2: 22 mmol/L (ref 22–32)
CREATININE: 0.6 mg/dL (ref 0.44–1.00)
Glucose, Bld: 94 mg/dL (ref 70–99)
Potassium: 3.8 mmol/L (ref 3.5–5.1)
SODIUM: 133 mmol/L — AB (ref 135–145)
Total Bilirubin: 0.7 mg/dL (ref 0.3–1.2)
Total Protein: 5.3 g/dL — ABNORMAL LOW (ref 6.5–8.1)

## 2018-09-03 LAB — CBC
HCT: 26.2 % — ABNORMAL LOW (ref 36.0–46.0)
HEMOGLOBIN: 8.9 g/dL — AB (ref 12.0–15.0)
MCH: 26.3 pg (ref 26.0–34.0)
MCHC: 34 g/dL (ref 30.0–36.0)
MCV: 77.5 fL — AB (ref 80.0–100.0)
NRBC: 0 % (ref 0.0–0.2)
PLATELETS: 132 10*3/uL — AB (ref 150–400)
RBC: 3.38 MIL/uL — AB (ref 3.87–5.11)
RDW: 14.2 % (ref 11.5–15.5)
WBC: 5.5 10*3/uL (ref 4.0–10.5)

## 2018-09-03 LAB — RPR: RPR Ser Ql: NONREACTIVE

## 2018-09-03 LAB — GLUCOSE, CAPILLARY
GLUCOSE-CAPILLARY: 115 mg/dL — AB (ref 70–99)
Glucose-Capillary: 88 mg/dL (ref 70–99)
Glucose-Capillary: 182 mg/dL — ABNORMAL HIGH (ref 70–99)

## 2018-09-03 LAB — BIRTH TISSUE RECOVERY COLLECTION (PLACENTA DONATION)

## 2018-09-03 MED ORDER — OXYCODONE HCL 5 MG PO TABS
5.0000 mg | ORAL_TABLET | ORAL | Status: DC | PRN
  Administered 2018-09-03 – 2018-09-07 (×12): 5 mg via ORAL
  Filled 2018-09-03 (×13): qty 1

## 2018-09-03 MED ORDER — FERROUS SULFATE 325 (65 FE) MG PO TABS
325.0000 mg | ORAL_TABLET | Freq: Three times a day (TID) | ORAL | Status: DC
  Administered 2018-09-03 – 2018-09-07 (×11): 325 mg via ORAL
  Filled 2018-09-03 (×12): qty 1

## 2018-09-03 MED ORDER — VITAMIN C 500 MG PO TABS
500.0000 mg | ORAL_TABLET | Freq: Two times a day (BID) | ORAL | Status: DC
  Administered 2018-09-03 – 2018-09-07 (×8): 500 mg via ORAL
  Filled 2018-09-03 (×13): qty 1

## 2018-09-03 MED ORDER — ACETAMINOPHEN 500 MG PO TABS
1000.0000 mg | ORAL_TABLET | Freq: Four times a day (QID) | ORAL | Status: AC
  Administered 2018-09-03 – 2018-09-04 (×3): 1000 mg via ORAL
  Filled 2018-09-03 (×3): qty 2

## 2018-09-03 NOTE — Lactation Note (Signed)
This note was copied from a baby's chart. Lactation Consultation Note  Patient Name: Shannon Dennis [id number] Date: 09/03/2018 Reason for consult: Follow-up assessment;Infant < 6lbs;Early term 20-38.6wks Baby is 35 hours old and has been formula fed x 4 and breastfed x1.  Mom states baby latched easily but sleepy at breast.  Instructed to feed with cues but at least every 3 hours.  Discussed post pumping in addition to stimulate milk supply.  Symphony pump set up and initiated.  Drops of colostrum obtained and finger fed to infant.  Instructed to call for assist/concerns prn.  Maternal Data    Feeding Feeding Type: Breast Fed  LATCH Score                   Interventions    Lactation Tools Discussed/Used Pump Review: Setup, frequency, and cleaning;Milk Storage Initiated by:: LM Date initiated:: 09/03/18   Consult Status Consult Status: Follow-up Date: 09/04/18 Follow-up type: In-patient    Huston Foley 09/03/2018, 11:20 AM

## 2018-09-03 NOTE — Lactation Note (Signed)
This note was copied from a baby's chart. Lactation Consultation Note Mom very sleepy unable to care for her baby. Baby in CN. Mom has no support person at bedside.  Mom told LC that she BF her 1st child for over 1 yr.  Informed LC would see her in am when more awake. Call if needed.  Patient Name: Shannon Dennis [id number] Date: 09/03/2018     Maternal Data    Feeding Feeding Type: Formula Nipple Type: Slow - flow  LATCH Score                   Interventions    Lactation Tools Discussed/Used     Consult Status      Effrey Davidow G 09/03/2018, 12:05 AM

## 2018-09-03 NOTE — Progress Notes (Signed)
Blood glucose was done approximally 30 min early

## 2018-09-03 NOTE — Progress Notes (Addendum)
Subjective: Postpartum Day 1: Repeat Cesarean Delivery  Patient reports no problems voiding.  No flatus. No BM. Tolerating PO intake. Bleeding decreased. BF and FF supplementation- she was very tired last night and baby was in nursery and fed formula. She has tried to feed some today, but baby sleep. Wants to BF. Baby boy for OP circ. Ambulating without assistance. Would like d/c on Friday (day 3).   Objective: Vital signs in last 24 hours: Temp:  [97.4 F (36.3 C)-98.9 F (37.2 C)] 98 F (36.7 C) (10/23 0827) Pulse Rate:  [61-97] 97 (10/23 0827) Resp:  [10-25] 20 (10/23 0400) BP: (105-141)/(63-94) 115/71 (10/23 0827) SpO2:  [88 %-100 %] 99 % (10/23 0827)  Physical Exam:  General: alert and no distress Lochia: appropriate Uterine Fundus: firm Incision: healing well DVT Evaluation: No evidence of DVT seen on physical exam.  Recent Labs    09/02/18 0824 09/03/18 0542  HGB 10.5* 8.9*  HCT 31.4* 26.2*    Assessment/Plan: Status post Cesarean section. Doing well postoperatively.  Talked about lactation and frequent feeding. Lactation consult. PP anemia - Increased ferrous sulfate frequency to tid and added vitamin C bid. Referral to [facility] for elevated B/Ps. - Had BP on admission, stable since birth. Reports 1 visit with BP elevations during antenatal care. Dx gestational HTN - no evidence of preeclampsia. Labs were done and are wnl. Will not repeat these unless otherwise indicated GDMA2 - does not need to continue glyburide or CBG. Needs pp GTT in 6-12wks in the office. Continue current care. Anticipate d/c  Day 3.   Faylene Million Severn Goddard 09/03/2018, 9:31 AM

## 2018-09-04 ENCOUNTER — Encounter (HOSPITAL_COMMUNITY): Payer: Self-pay | Admitting: *Deleted

## 2018-09-04 DIAGNOSIS — O99019 Anemia complicating pregnancy, unspecified trimester: Secondary | ICD-10-CM | POA: Diagnosis present

## 2018-09-04 LAB — GLUCOSE, CAPILLARY
GLUCOSE-CAPILLARY: 138 mg/dL — AB (ref 70–99)
GLUCOSE-CAPILLARY: 94 mg/dL (ref 70–99)

## 2018-09-04 MED ORDER — ACETAMINOPHEN 500 MG PO TABS
1000.0000 mg | ORAL_TABLET | Freq: Three times a day (TID) | ORAL | Status: DC | PRN
  Administered 2018-09-04 – 2018-09-06 (×5): 1000 mg via ORAL
  Filled 2018-09-04 (×5): qty 2

## 2018-09-04 NOTE — Progress Notes (Signed)
S: RN called report to having a HA at 1500 and noted elevated BP. Pt with h/O GHTN during pregnancy and negative Preeclampsia labs on admission.  O: Patient Vitals for the past 24 hrs:  BP Temp Temp src Pulse Resp SpO2  09/04/18 1635 130/74 98.2 F (36.8 C) Oral 86 - 100 %  09/04/18 1443 (!) 151/93 - - - - -  09/04/18 1442 (!) 152/92 97.7 F (36.5 C) Oral 83 19 -  09/04/18 0617 124/83 98.4 F (36.9 C) Oral 85 18 100 %  09/03/18 2100 120/73 97.6 F (36.4 C) - 84 18 100 %   A/P GHTN: RN to recheck BP in one and which relieved 130/74, Dr Estanislado Pandy aware, will continue to monitor BP.  HA: Given 1000mg  tylenol and recheck in one hour pt endorses improvement of HA. Pt walking in halls.

## 2018-09-04 NOTE — Lactation Note (Signed)
This note was copied from a baby's chart. Lactation Consultation Note  Patient Name: Shannon Dennis [id number] Date: 09/04/2018 Reason for consult: Follow-up assessment;Early term 37-38.6wks;Infant < 6lbs Mom has been both breast and formula feeding.  She states baby is latching with ease.  She has not been pumping because she was having headaches. Feeling better today so encouraged to begin pumping every 3 hours.  Instructed to feed with cues and call for assist prn.  Baby recently BF for 15 minutes and sleeping now.  Maternal Data    Feeding    LATCH Score                   Interventions    Lactation Tools Discussed/Used     Consult Status Consult Status: Follow-up Date: 09/05/18 Follow-up type: In-patient    Shannon Dennis 09/04/2018, 11:03 AM

## 2018-09-04 NOTE — Progress Notes (Signed)
[facility] CNM notified of pt's slightly elevated blood pressure with c/o headache

## 2018-09-04 NOTE — Progress Notes (Signed)
Strongly encouraged mom to get out of bed and ambulate.  Educated her on how this will help her feel better.  She has not passed flatus or had a bowel movement yet and has had increasing amounts of pain.

## 2018-09-04 NOTE — Lactation Note (Signed)
This note was copied from a baby's chart. Lactation Consultation Note  Patient Name: Shannon Dennis [id number] Date: 09/04/2018 Reason for consult: Follow-up assessment Mom called out with concern about baby falling asleep at the breast.  Observed baby latch easily to breast.  Baby responded well with waking techniques.  Recommended mom also try feeding skin to skin next time. Encouraged to call for assist/concerns prn.  Maternal Data    Feeding Feeding Type: Breast Fed  LATCH Score Latch: Grasps breast easily, tongue down, lips flanged, rhythmical sucking.  Audible Swallowing: A few with stimulation  Type of Nipple: Everted at rest and after stimulation  Comfort (Breast/Nipple): Soft / non-tender  Hold (Positioning): No assistance needed to correctly position infant at breast.  LATCH Score: 9  Interventions    Lactation Tools Discussed/Used     Consult Status Consult Status: Follow-up Date: 09/05/18 Follow-up type: In-patient    Huston Foley 09/04/2018, 2:59 PM

## 2018-09-04 NOTE — Progress Notes (Addendum)
Shannon Dennis [id number]  Shannon Dennis is a 42 y.o. female, G4P3003 at 37.2 weeks, presenting for repeat LTCS.Prenatal hx prior history of LTCS x3.  GHTN controlled not on meds, AMA, GDMA2 on glyburide 2.5, abnormal genetic testing with normal mfm [place]. Postpartum Postoperative Day # 2   Karin Golden, [id number], [redacted]w[redacted]d, S/P repeat LT Cesarean Section, elective.   Subjective: Patient up ad lib. Reports consuming regular diet without issues and denies N/V. Patient reports no bowel movement + passing flatus.  Denies issues with urination and reports bleeding is "light."  Patient is breastfeeding and reports going well.  Desires Nexplanon for postpartum contraception.  Pain is being appropriately managed with use of PO pain meds. Mild symptomatic anemia, post-op Hgb 8.9. Pt experiencing fatigue and dizziness at times. Currently pt denies h/a, vision changes, RUQ pain.   Objective: Patient Vitals for the past 24 hrs:  BP Temp Temp src Pulse Resp SpO2  09/04/18 0617 124/83 98.4 F (36.9 C) Oral 85 18 100 %  09/03/18 2100 120/73 97.6 F (36.4 C) - 84 18 100 %  09/03/18 1255 140/85 98.3 F (36.8 C) - 100 - -     Physical Exam:  General: alert, cooperative, appears stated age and no distress Mood/Affect: happy Lungs: clear to auscultation, no wheezes, rales or rhonchi, symmetric air entry.  Heart: normal rate, regular rhythm, normal S1, S2, no murmurs, rubs, clicks or gallops. Breast: breasts appear normal, no suspicious masses, no skin or nipple changes or axillary nodes. Abdomen:  + bowel sounds, soft, non-tender Incision: healing well, no significant drainage, no dehiscence, no significant erythema, Honeycomb dressing, CDI  Uterine Fundus: firm, involution, U/2. Lochia: appropriate Skin: Warm, Dry. DVT Evaluation: No evidence of DVT seen on physical exam. Negative Homan's sign. No cords or calf tenderness. No significant calf/ankle edema.   Labs: Recent Labs    09/02/18 0824  09/03/18 0542  HGB 10.5* 8.9*  HCT 31.4* 26.2*  WBC 6.8 5.5    CBG (last 3)  Recent Labs    09/03/18 1936 09/04/18 0032 09/04/18 0623  GLUCAP 115* 138* 94     I/O: I/O last 3 completed shifts: In: 720 [P.O.:720] Out: 1200 [Urine:1200]   Assessment Postpartum Postoperative Day # 2. Karin Golden, [phone], [redacted]w[redacted]d, S/P repeat LT Cesarean Section.  Pt stable. -2 Involution. Breastfeeding, mild asymptomatic anemia, Hgb 8.9, on iron. Hemodynamically Stable. Hx of GHTN, B/P controlled without meds. Last 124/83. Asymptomatic for PreE. GDM. Last fasting CBG 94. No meds PP.  Plan: Continue other mgmt as ordered VTE Prophylactics: SCD, ambulated as tolerates.  Pain control: Motrin/Tylenol/Narcotics PRN Anemia: on iron GHTN: continue to monitor B/P. If increased over 150 systolic, will start procardia. GDM: D/c'd CBGs. Education given regarding options for contraception, including Nexplanon.  Plan for discharge tomorrow, Breastfeeding, Lactation consult and Contraception Nexplanon  Dr. Estanislado Pandy updated on patient status  Zelina Jimerson NP-C, CNM 09/04/2018, 9:33 AM

## 2018-09-04 NOTE — Plan of Care (Signed)
See previous note

## 2018-09-05 DIAGNOSIS — O1494 Unspecified pre-eclampsia, complicating childbirth: Secondary | ICD-10-CM | POA: Diagnosis present

## 2018-09-05 LAB — COMPREHENSIVE METABOLIC PANEL
ALT: 35 U/L (ref 0–44)
ANION GAP: 10 (ref 5–15)
AST: 50 U/L — ABNORMAL HIGH (ref 15–41)
Albumin: 2.6 g/dL — ABNORMAL LOW (ref 3.5–5.0)
Alkaline Phosphatase: 88 U/L (ref 38–126)
BUN: 8 mg/dL (ref 6–20)
CHLORIDE: 103 mmol/L (ref 98–111)
CO2: 24 mmol/L (ref 22–32)
Calcium: 8.5 mg/dL — ABNORMAL LOW (ref 8.9–10.3)
Creatinine, Ser: 0.46 mg/dL (ref 0.44–1.00)
Glucose, Bld: 79 mg/dL (ref 70–99)
POTASSIUM: 3.5 mmol/L (ref 3.5–5.1)
Sodium: 137 mmol/L (ref 135–145)
Total Bilirubin: 0.2 mg/dL — ABNORMAL LOW (ref 0.3–1.2)
Total Protein: 6.4 g/dL — ABNORMAL LOW (ref 6.5–8.1)

## 2018-09-05 LAB — CBC
HCT: 24.5 % — ABNORMAL LOW (ref 36.0–46.0)
Hemoglobin: 8.2 g/dL — ABNORMAL LOW (ref 12.0–15.0)
MCH: 26.5 pg (ref 26.0–34.0)
MCHC: 33.5 g/dL (ref 30.0–36.0)
MCV: 79.3 fL — AB (ref 80.0–100.0)
PLATELETS: 166 10*3/uL (ref 150–400)
RBC: 3.09 MIL/uL — AB (ref 3.87–5.11)
RDW: 14.7 % (ref 11.5–15.5)
WBC: 6.4 10*3/uL (ref 4.0–10.5)
nRBC: 0.9 % — ABNORMAL HIGH (ref 0.0–0.2)

## 2018-09-05 LAB — URIC ACID: URIC ACID, SERUM: 3.7 mg/dL (ref 2.5–7.1)

## 2018-09-05 LAB — PROTEIN / CREATININE RATIO, URINE
CREATININE, URINE: 107 mg/dL
Protein Creatinine Ratio: 0.21 mg/mg{Cre} — ABNORMAL HIGH (ref 0.00–0.15)
TOTAL PROTEIN, URINE: 23 mg/dL

## 2018-09-05 MED ORDER — LABETALOL HCL 100 MG PO TABS: 100.0000 mg | ORAL_TABLET | Freq: Two times a day (BID) | ORAL | 1 refills | Status: DC

## 2018-09-05 MED ORDER — SUMATRIPTAN SUCCINATE 50 MG PO TABS: 50.0000 mg | ORAL_TABLET | Freq: Once | ORAL | 2 refills | Status: DC | PRN

## 2018-09-05 MED ORDER — LABETALOL HCL 100 MG PO TABS
100.0000 mg | ORAL_TABLET | Freq: Two times a day (BID) | ORAL | Status: DC
  Administered 2018-09-05 – 2018-09-06 (×2): 100 mg via ORAL
  Filled 2018-09-05 (×2): qty 1

## 2018-09-05 MED ORDER — OXYCODONE HCL 5 MG PO TABS: 5.0000 mg | ORAL_TABLET | ORAL | 0 refills | Status: AC | PRN

## 2018-09-05 MED ORDER — FERROUS SULFATE 325 (65 FE) MG PO TABS: 325.0000 mg | ORAL_TABLET | Freq: Two times a day (BID) | ORAL | 3 refills | Status: AC

## 2018-09-05 MED ORDER — FERROUS SULFATE 325 (65 FE) MG PO TABS: 325.0000 mg | ORAL_TABLET | Freq: Two times a day (BID) | ORAL | 3 refills | Status: DC

## 2018-09-05 MED ORDER — LABETALOL HCL 100 MG PO TABS
100.0000 mg | ORAL_TABLET | Freq: Once | ORAL | Status: AC
  Administered 2018-09-05: 100 mg via ORAL
  Filled 2018-09-05: qty 1

## 2018-09-05 MED ORDER — ACETAMINOPHEN 500 MG PO TABS: 1000.0000 mg | ORAL_TABLET | Freq: Three times a day (TID) | ORAL | 0 refills | Status: DC | PRN

## 2018-09-05 MED ORDER — IBUPROFEN 600 MG PO TABS: 600.0000 mg | ORAL_TABLET | Freq: Four times a day (QID) | ORAL | 0 refills | Status: DC

## 2018-09-05 MED ORDER — OXYCODONE HCL 5 MG PO TABS: 5.0000 mg | ORAL_TABLET | ORAL | 0 refills | Status: DC | PRN

## 2018-09-05 NOTE — Lactation Note (Signed)
This note was copied from a baby's chart. Lactation Consultation Note  Patient Name: Shannon Dennis [id number] Date: 09/05/2018 Reason for consult: Follow-up assessment Mom states baby is breastfeeding well.  She continues to both breastfeed and formula feed.  Breasts filling.  Discussed milk coming to volume and the prevention and treatment of engorgement.  Manual pump given with instructions for prn use.  Mom denies questions or concerns.  Lactation outpatient services and support reviewed and encouraged prn.  Maternal Data    Feeding Feeding Type: Breast Fed  LATCH Score                   Interventions    Lactation Tools Discussed/Used     Consult Status Consult Status: Complete Follow-up type: Call as needed    Huston Foley 09/05/2018, 9:07 AM

## 2018-09-05 NOTE — Progress Notes (Signed)
S: Pt h/a improved some after Labetalol. Slept after for a while.   O: BP (!) 141/82 (BP Location: Left Arm)   Pulse (!) 101   Temp 97.9 F (36.6 C) (Oral)   Resp 18   Ht 5\' 3"  (1.6 m)   Wt 88.7 kg   SpO2 98%   BMI 34.65 kg/m     A&Ox3, holding baby, BF Resp: without effort, normal expansion Abd: Incision clean, dry and intact, FF@U -1, POS BS x4 Ext: Neg homans, min edema, nml reflexes  Pre-E labs wnl.  A: POD#3 s/p Repeat x 4 LTCS      GHTN P: Consulted with Dr. Su Hilt and Sallye Ober - continue BID labetalol and spend one more night admitted.  Will re-evaluate in am for discharge.  Pt in agreement with POC.

## 2018-09-05 NOTE — Progress Notes (Signed)
Patient has been getting reoccurring h/a each afternoon and her blood pressures have also been increasing.  B/p with h/a 156/95.  Francoise Schaumann CNP called.  Orders given and her will come see

## 2018-09-05 NOTE — Discharge Summary (Addendum)
Pt had increase in BP prior to discharge and received 1 dose of po 100mg  labetalol. RX for 100mg  po bid labetalol sent to pharmacy.   OB Discharge Summary     Patient Name: Shannon Dennis DOB: 09/28/1976 MRN: [id number]  Date of admission: 09/02/2018 Delivering MD: Silverio Lay   Date of discharge: 09/05/2018  Admitting diagnosis: Prior Cesarean Section Intrauterine pregnancy: [redacted]w[redacted]d     Secondary diagnosis:  Principal Problem:   Cesarean delivery delivered Active Problems:   GDM (gestational diabetes mellitus)   Anemia affecting pregnancy   Gestational hypertension with significant proteinuria, delivered  Additional problems: GHTN     Discharge diagnosis: Gestational Hypertension                                                                                                Post partum procedures: None  Augmentation: N/A Complications: None  Hospital course:  Sceduled C/S   42 y.o. yo G4P3003 at [redacted]w[redacted]d was admitted to the hospital 09/02/2018 for scheduled cesarean section with the following indication:Elective Repeat.  Membrane Rupture Time/Date: 10:43 AM ,09/02/2018   Patient delivered a Viable infant.09/02/2018  Details of operation can be found in separate operative note.  Pateint had an uncomplicated postpartum course.  She is ambulating, tolerating a regular diet, passing flatus, and urinating well. Patient is discharged home in stable condition on  09/05/18  Doing well, some pain controlled with motrin/tylenol and oxy.         Physical exam  Vitals:   09/04/18 2110 09/04/18 2242 09/04/18 2340 09/05/18 0500  BP: 139/90 (!) 147/83 (!) 147/87 (!) 144/87  Pulse:  92 96 87  Resp: 18 17 18 20   Temp: 98.2 F (36.8 C) 97.6 F (36.4 C) 98.1 F (36.7 C) 98 F (36.7 C)  TempSrc: Oral Oral Oral Oral  SpO2: 100% 100% 98% 100%  Weight:      Height:       General: alert Lochia: appropriate Uterine Fundus: firm Incision: Healing well with no significant drainage DVT  Evaluation: No evidence of DVT seen on physical exam. Labs: Lab Results  Component Value Date   WBC 5.5 09/03/2018   HGB 8.9 (L) 09/03/2018   HCT 26.2 (L) 09/03/2018   MCV 77.5 (L) 09/03/2018   PLT 132 (L) 09/03/2018   CMP Latest Ref Rng & Units 09/03/2018  Glucose 70 - 99 mg/dL 94  BUN 6 - 20 mg/dL 7  Creatinine 4.69 - 6.29 mg/dL 5.28  Sodium 413 - 244 mmol/L 133(L)  Potassium 3.5 - 5.1 mmol/L 3.8  Chloride 98 - 111 mmol/L 104  CO2 22 - 32 mmol/L 22  Calcium 8.9 - 10.3 mg/dL 0.1(U)  Total Protein 6.5 - 8.1 g/dL 5.3(L)  Total Bilirubin 0.3 - 1.2 mg/dL 0.7  Alkaline Phos 38 - 126 U/L 97  AST 15 - 41 U/L 19  ALT 0 - 44 U/L 11    Discharge instruction: per After Visit Summary and "Baby and Me Booklet".  After visit meds:    Diet: home with mother  Activity: Advance as tolerated. Pelvic rest for 6 weeks.  Outpatient follow up:1 week Blood Pressure Check Follow up Appt:No future appointments. Follow up Visit:No follow-ups on file.  Postpartum contraception: None  Newborn Data: Live born female  Birth Weight: 5 lb 13.7 oz (2655 g) APGAR: 8, 8  Newborn Delivery   Birth date/time:  09/02/2018 10:43:00 Delivery type:  C-Section, Low Transverse Trial of labor:  No C-section categorization:  Repeat     Baby Feeding: Bottle/Breast Disposition:home with mother   09/05/2018 Altamese Cabal, CNM

## 2018-09-06 ENCOUNTER — Encounter (HOSPITAL_COMMUNITY): Payer: Self-pay

## 2018-09-06 MED ORDER — NIFEDIPINE ER OSMOTIC RELEASE 30 MG PO TB24
30.0000 mg | ORAL_TABLET | Freq: Every day | ORAL | Status: DC
  Administered 2018-09-06: 30 mg via ORAL
  Filled 2018-09-06: qty 1

## 2018-09-06 MED ORDER — NIFEDIPINE ER 30 MG PO TB24: 30.0000 mg | ORAL_TABLET | Freq: Every day | ORAL | 2 refills | Status: DC

## 2018-09-06 MED ORDER — NIFEDIPINE ER OSMOTIC RELEASE 30 MG PO TB24
30.0000 mg | ORAL_TABLET | Freq: Once | ORAL | Status: AC
  Administered 2018-09-06: 30 mg via ORAL
  Filled 2018-09-06: qty 1

## 2018-09-06 MED ORDER — HYDROCHLOROTHIAZIDE 25 MG PO TABS
25.0000 mg | ORAL_TABLET | Freq: Every day | ORAL | Status: DC
  Administered 2018-09-06 – 2018-09-07 (×2): 25 mg via ORAL
  Filled 2018-09-06 (×3): qty 1

## 2018-09-06 MED ORDER — NIFEDIPINE ER OSMOTIC RELEASE 30 MG PO TB24
60.0000 mg | ORAL_TABLET | Freq: Every day | ORAL | Status: DC
  Administered 2018-09-07: 60 mg via ORAL
  Filled 2018-09-06: qty 2

## 2018-09-06 NOTE — Lactation Note (Signed)
This note was copied from a baby's chart. Lactation Consultation Note; Mom reports baby has been nursing well. Reports breasts are getting fuller this morning. Reports baby just fed for 15 min but is still showing feeding cues. Suggested latching baby again and mom agreeable. Baby latched well with no assist from  me. Encouraged to keep baby deep onto the breast throughout the feeding. No questions at present. Baby going off to sleep To call prn  Patient Name: Shannon Dennis RUEAV'W Date: 09/06/2018 Reason for consult: Follow-up assessment   Maternal Data Formula Feeding for Exclusion: No Has patient been taught Hand Expression?: Yes Does the patient have breastfeeding experience prior to this delivery?: Yes  Feeding Feeding Type: Breast Fed  LATCH Score Latch: Grasps breast easily, tongue down, lips flanged, rhythmical sucking.  Audible Swallowing: A few with stimulation  Type of Nipple: Everted at rest and after stimulation  Comfort (Breast/Nipple): Soft / non-tender  Hold (Positioning): No assistance needed to correctly position infant at breast.  LATCH Score: 9  Interventions Interventions: Breast feeding basics reviewed;Breast compression;Hand express  Lactation Tools Discussed/Used     Consult Status Consult Status: Complete    Pamelia Hoit 09/06/2018, 10:16 AM

## 2018-09-06 NOTE — Progress Notes (Signed)
S:  Pt denies headache blurred vision.  Pain controlled.  States did not sleep well.   O: Vitals:   09/06/18 0935 09/06/18 1050  BP: (!) 158/88 (!) 150/81  Pulse: 85 81  Resp:    Temp:    SpO2:    Alert and orientated x3 Lungs clear Heart RRR Abd soft incision covered and dry Fundus firm at U sm rubra Legs negative A:  POD 4 with hypertension LTCS x4 stable P;  Will change BP medication to Procardia 30 xl and d/c labetalol.  If BP stable this afternoon will dc home. Dr. Su Hilt aware of poc.

## 2018-09-06 NOTE — Progress Notes (Addendum)
Verified with Bernerd Pho, CNM that no more bp medicine is ordered to be given tonight; patient's last dose was 30mg  Procardia at 1845. Continue to monitor bp q4.

## 2018-09-07 MED ORDER — HYDROCHLOROTHIAZIDE 25 MG PO TABS: 25.0000 mg | ORAL_TABLET | Freq: Every day | ORAL | 0 refills | Status: AC

## 2018-09-07 MED ORDER — NIFEDIPINE ER 60 MG PO TB24: 60.0000 mg | ORAL_TABLET | Freq: Every day | ORAL | 1 refills | Status: AC

## 2018-09-07 NOTE — Discharge Summary (Addendum)
OB Discharge Summary                           Patient Name: Shannon Dennis DOB: 02-Nov-1976 MRN: [id number]  Date of admission: 09/02/2018 Delivering MD: Silverio Lay   Date of discharge: 09/07/2018  Admitting diagnosis: Prior Cesarean Section Intrauterine pregnancy: [redacted]w[redacted]d     Secondary diagnosis:  Principal Problem:   Cesarean delivery delivered Active Problems:   GDM (gestational diabetes mellitus)   Anemia affecting pregnancy   Gestational hypertension with significant proteinuria, delivered Skin tags  Additional problems: GHTN                                      Discharge diagnosis: Gestational Hypertension, Delivery by repeat cesarean section, skin tags excisions.                                                                                                 Post partum procedures: None  Augmentation: N/A Complications: None  Hospital course:  Sceduled C/S   42 y.o. yo G4P3003 at [redacted]w[redacted]d was admitted to the hospital 09/02/2018 for scheduled cesarean section with the following indication:Gestational hypertension and prior cesarean section delivery.   Membrane Rupture Time/Date: 10:43 AM ,09/02/2018   Patient delivered a Viable infant.09/02/2018  Details of operation can be found in separate operative note.  Patient had a postpartum course complicated by elevated blood pressures for which she initially took oral labetalol which was later switched to Procardia and hydrochlorothiazide which controlled her blood pressures.  She is ambulating, tolerating a regular diet, passing flatus, and urinating well. Patient is discharged home in stable condition on  09/07/18  Doing well, some pain controlled with motrin/tylenol and oxycodone.         Physical exam  Vitals:   09/04/18 2110 09/04/18 2242 09/04/18 2340 09/05/18 0500  BP: 139/90 (!) 147/83 (!) 147/87 (!) 144/87  Pulse:  92 96 87  Resp: 18 17 18 20   Temp: 98.2 F (36.8 C) 97.6 F (36.4 C) 98.1 F (36.7 C) 98 F  (36.7 C)  TempSrc: Oral Oral Oral Oral  SpO2: 100% 100% 98% 100%  Weight:      Height:       Vitals:   09/07/18 0300 09/07/18 0630 09/07/18 0900 09/07/18 1210  BP: 134/84 132/86 138/85 132/85  Pulse: 84 93 86 85  Resp: 16 16    Temp: 98 F (36.7 C) 98.1 F (36.7 C)    TempSrc:  Oral    SpO2: 96%     Weight:      Height:       General: alert Lochia: appropriate Uterine Fundus: firm Incision: Healing well with no significant drainage DVT Evaluation: No evidence of DVT seen on physical exam. Labs: RecentLabs       Lab Results  Component Value Date   WBC 5.5 09/03/2018   HGB 8.9 (L) 09/03/2018   HCT 26.2 (L) 09/03/2018   MCV 77.5 (L) 09/03/2018   PLT  132 (L) 09/03/2018     CMP Latest Ref Rng & Units 09/03/2018  Glucose 70 - 99 mg/dL 94  BUN 6 - 20 mg/dL 7  Creatinine 1.61 - 0.96 mg/dL 0.45  Sodium 409 - 811 mmol/L 133(L)  Potassium 3.5 - 5.1 mmol/L 3.8  Chloride 98 - 111 mmol/L 104  CO2 22 - 32 mmol/L 22  Calcium 8.9 - 10.3 mg/dL 9.1(Y)  Total Protein 6.5 - 8.1 g/dL 5.3(L)  Total Bilirubin 0.3 - 1.2 mg/dL 0.7  Alkaline Phos 38 - 126 U/L 97  AST 15 - 41 U/L 19  ALT 0 - 44 U/L 11   CBC    Component Value Date/Time   WBC 6.4 09/05/2018 1711   RBC 3.09 (L) 09/05/2018 1711   HGB 8.2 (L) 09/05/2018 1711   HCT 24.5 (L) 09/05/2018 1711   PLT 166 09/05/2018 1711   MCV 79.3 (L) 09/05/2018 1711   MCH 26.5 09/05/2018 1711   MCHC 33.5 09/05/2018 1711   RDW 14.7 09/05/2018 1711   LYMPHSABS 0.7 04/13/2015 2155   MONOABS 0.7 04/13/2015 2155   EOSABS 0.0 04/13/2015 2155   BASOSABS 0.0 04/13/2015 2155   CMP     Component Value Date/Time   NA 137 09/05/2018 1711   K 3.5 09/05/2018 1711   CL 103 09/05/2018 1711   CO2 24 09/05/2018 1711   GLUCOSE 79 09/05/2018 1711   BUN 8 09/05/2018 1711   CREATININE 0.46 09/05/2018 1711   CALCIUM 8.5 (L) 09/05/2018 1711   PROT 6.4 (L) 09/05/2018 1711   ALBUMIN 2.6 (L) 09/05/2018 1711   AST 50 (H) 09/05/2018  1711   ALT 35 09/05/2018 1711   ALKPHOS 88 09/05/2018 1711   BILITOT 0.2 (L) 09/05/2018 1711   GFRNONAA >60 09/05/2018 1711   GFRAA >60 09/05/2018 1711   Contains abnormal data Protein / creatinine ratio, urine  Order: [id number]  Status:  Final result Visible to patient:  No (Not Released) Next appt:  None   Ref Range & Units 2d ago 9d ago  Creatinine, Urine mg/dL 086.57  846.96   Total Protein, Urine mg/dL 23  24 CM  Comment: NO NORMAL RANGE ESTABLISHED FOR THIS TEST  Protein Creatinine Ratio 0.00 - 0.15 mg/mg 0.21High   0.18High  CM        Discharge instruction: per After Visit Summary and "Baby and Me Booklet".  After visit meds:    Diet: home with mother  Activity: Advance as tolerated. Pelvic rest for 6 weeks.   Outpatient follow up:1 week Blood Pressure Check Follow up Appt:No future appointments. Follow up Visit:No follow-ups on file.  Postpartum contraception: None  Newborn Data: Live born female  Birth Weight: 5 lb 13.7 oz (2655 g) APGAR: 8, 8  Newborn Delivery   Birth date/time:  09/02/2018 10:43:00 Delivery type:  C-Section, Low Transverse Trial of labor:  No C-section categorization:  Repeat     Baby Feeding: Bottle/Breast Disposition:home with mother  Dr. Hoover Browns 09/07/2018 1338.

## 2018-09-12 ENCOUNTER — Encounter (HOSPITAL_COMMUNITY): Admission: RE | Admit: 2018-09-12 | Payer: Medicaid Other | Source: Ambulatory Visit

## 2018-10-29 ENCOUNTER — Encounter (HOSPITAL_COMMUNITY): Payer: Self-pay

## 2018-10-29 ENCOUNTER — Ambulatory Visit (HOSPITAL_COMMUNITY)
Admission: EM | Admit: 2018-10-29 | Discharge: 2018-10-29 | Disposition: A | Discharge status: Home / Self Care | Payer: Medicaid Other | Attending: Family Medicine | Admitting: Family Medicine

## 2018-10-29 DIAGNOSIS — B349 Viral infection, unspecified: Secondary | ICD-10-CM | POA: Insufficient documentation

## 2018-10-29 MED ORDER — ACETAMINOPHEN 500 MG PO TABS: 1000.0000 mg | ORAL_TABLET | Freq: Three times a day (TID) | ORAL | 0 refills | Status: AC | PRN

## 2018-10-29 MED ORDER — IPRATROPIUM BROMIDE 0.06 % NA SOLN: 2.0000 | Freq: Four times a day (QID) | NASAL | 0 refills | Status: AC

## 2018-10-29 MED ORDER — FLUTICASONE PROPIONATE 50 MCG/ACT NA SUSP: 2.0000 | Freq: Every day | NASAL | 0 refills | Status: AC

## 2018-10-29 MED ORDER — IBUPROFEN 800 MG PO TABS: 800.0000 mg | ORAL_TABLET | Freq: Three times a day (TID) | ORAL | 0 refills | Status: AC

## 2018-10-29 NOTE — ED Provider Notes (Signed)
[facility]    CSN: [id number] Arrival date & time: 10/29/18  0913     History   Chief Complaint Chief Complaint  Patient presents with  . Cough  . Fever  . Headache    HPI Shannon Dennis is a 42 y.o. female.   42 year old female comes in for 2-day history of URI symptoms.  Has had cough, rhinorrhea, nasal congestion.  Has had headaches, mostly frontal.  Had subjective fever with chills.  Denies abdominal pain, nausea, vomiting.  Has still been eating and drinking without difficulty.  Has not taken anything for symptoms.  Patient currently breast-feeding 91-month-old infant.  Never smoker.     Past Medical History:  Diagnosis Date  . Gestational diabetes 2012    Patient Active Problem List   Diagnosis Date Noted  . Gestational hypertension with significant proteinuria, delivered 09/05/2018  . Anemia affecting pregnancy 09/04/2018  . GDM (gestational diabetes mellitus) 09/02/2018  . Cesarean delivery delivered 08/02/2015  . Gestational diabetes--diet controlled 07/28/2015  . Previous cesarean section x 2 07/28/2015  . AMA (advanced maternal age) multigravida 35+ 07/28/2015  . 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 BREAST SURGERY     right  . CESAREAN SECTION     one previous  . CESAREAN SECTION N/A 08/02/2015   Procedure: CESAREAN SECTION;  Surgeon: Hoover Browns, MD;  Location: [facility];  Service: Obstetrics;  Laterality: N/A;  . CESAREAN SECTION N/A 09/02/2018   Procedure: REPEAT CESAREAN SECTION;  Surgeon: Silverio Lay, MD;  Location: [facility];  Service: Obstetrics;  Laterality: N/A;  RNFA Requested  . INCISION AND DRAINAGE BREAST ABSCESS     left    OB History    Gravida  4   Para  3   Term  3   Preterm      AB      Living  3     SAB      TAB      Ectopic      Multiple  0   Live Births  3            Home Medications    Prior to Admission  medications   Medication Sig Start Date End Date Taking? Authorizing Provider  acetaminophen (TYLENOL) 500 MG tablet Take 2 tablets (1,000 mg total) by mouth every 8 (eight) hours as needed for mild pain or headache. 10/29/18   Cathie Hoops,  V, PA-C  ferrous sulfate 325 (65 FE) MG tablet Take 1 tablet (325 mg total) by mouth 2 (two) times daily with a meal. 09/05/18   Altamese Cabal, CNM  fluticasone (FLONASE) 50 MCG/ACT nasal spray Place 2 sprays into both nostrils daily. 10/29/18   Cathie Hoops,  V, PA-C  hydrochlorothiazide (HYDRODIURIL) 25 MG tablet Take 1 tablet (25 mg total) by mouth daily. 09/08/18 10/18/18  Hoover Browns, MD  ibuprofen (ADVIL,MOTRIN) 800 MG tablet Take 1 tablet (800 mg total) by mouth 3 (three) times daily. 10/29/18   Cathie Hoops,  V, PA-C  ipratropium (ATROVENT) 0.06 % nasal spray Place 2 sprays into both nostrils 4 (four) times daily. 10/29/18   Cathie Hoops,  V, PA-C  NIFEdipine (ADALAT CC) 60 MG 24 hr tablet Take 1 tablet (60 mg total) by mouth daily. Take this medication as well as the hydrochlorothiazide.  Do not take labetalol. 09/08/18   Hoover Browns, MD  oxyCODONE (OXY IR/ROXICODONE) 5 MG immediate release tablet Take 1 tablet (5  mg total) by mouth every 4 (four) hours as needed for breakthrough pain. 09/05/18   Altamese Cabal, CNM  Prenatal Vit-Fe Fumarate-FA (PRENATAL VITAMIN PO) Take by mouth.    [provider]    Family History Family History  Problem Relation Age of Onset  . Diabetes Mother   . Hypertension Mother   . Diabetes Father   . Hypertension Father     Social History Social History   Tobacco Use  . Smoking status: Never Smoker  . Smokeless tobacco: Never Used  Substance Use Topics  . Alcohol use: No  . Drug use: No     Allergies   Patient has no known allergies.   Review of Systems Review of Systems  Reason unable to perform ROS: See HPI as above.     Physical Exam Triage Vital Signs ED Triage Vitals  Enc Vitals Group     BP 10/29/18 0947 120/82      Pulse Rate 10/29/18 0947 74     Resp 10/29/18 0947 16     Temp 10/29/18 0947 98.4 F (36.9 C)     Temp Source 10/29/18 0947 Oral     SpO2 10/29/18 0947 98 %     Weight --      Height --      Head Circumference --      Peak Flow --      Pain Score 10/29/18 0950 8     Pain Loc --      Pain Edu? --      Excl. in GC? --    No data found.  Updated Vital Signs BP 120/82 (BP Location: Left Arm)   Pulse 74   Temp 98.4 F (36.9 C) (Oral)   Resp 16   SpO2 98%   Breastfeeding Unknown   Physical Exam Constitutional:      General: She is not in acute distress.    Appearance: She is well-developed. She is not ill-appearing, toxic-appearing or diaphoretic.  HENT:     Head: Normocephalic and atraumatic.     Right Ear: Tympanic membrane, ear canal and external ear normal. Tympanic membrane is not erythematous or bulging.     Left Ear: Tympanic membrane, ear canal and external ear normal. Tympanic membrane is not erythematous or bulging.     Nose: Congestion and rhinorrhea present.     Right Sinus: Maxillary sinus tenderness present. No frontal sinus tenderness.     Left Sinus: Maxillary sinus tenderness present. No frontal sinus tenderness.     Mouth/Throat:     Pharynx: Uvula midline.  Eyes:     Conjunctiva/sclera: Conjunctivae normal.     Pupils: Pupils are equal, round, and reactive to light.  Neck:     Musculoskeletal: Normal range of motion and neck supple.  Cardiovascular:     Rate and Rhythm: Normal rate and regular rhythm.     Heart sounds: Normal heart sounds. No murmur. No friction rub. No gallop.   Pulmonary:     Effort: Pulmonary effort is normal.     Breath sounds: Normal breath sounds. No decreased breath sounds, wheezing, rhonchi or rales.  Lymphadenopathy:     Cervical: No cervical adenopathy.  Skin:    General: Skin is warm and dry.  Neurological:     Mental Status: She is alert and oriented to person, place, and time.  Psychiatric:        Behavior:  Behavior normal.        Judgment: Judgment normal.  UC Treatments / Results  Labs (all labs ordered are listed, but only abnormal results are displayed) Labs Reviewed - No data to display  EKG None  Radiology No results found.  Procedures Procedures (including critical care time)  Medications Ordered in UC Medications - No data to display  Initial Impression / Assessment and Plan / UC Course  I have reviewed the triage vital signs and the nursing notes.  Pertinent labs & imaging results that were available during my care of the patient were reviewed by me and considered in my medical decision making (see chart for details).    Discussed with patient history and exam most consistent with viral URI. Symptomatic treatment as needed. Push fluids. Return precautions given.   Final Clinical Impressions(s) / UC Diagnoses   Final diagnoses:  Viral illness    ED Prescriptions    Medication Sig Dispense Auth. Provider   acetaminophen (TYLENOL) 500 MG tablet Take 2 tablets (1,000 mg total) by mouth every 8 (eight) hours as needed for mild pain or headache. 30 tablet ,  V, PA-C   ibuprofen (ADVIL,MOTRIN) 800 MG tablet Take 1 tablet (800 mg total) by mouth 3 (three) times daily. 21 tablet ,  V, PA-C   fluticasone (FLONASE) 50 MCG/ACT nasal spray Place 2 sprays into both nostrils daily. 1 g ,  V, PA-C   ipratropium (ATROVENT) 0.06 % nasal spray Place 2 sprays into both nostrils 4 (four) times daily. 15 mL Threasa Alpha,  V, PA-C        ,  V, [place]PA[place] 10/29/18 1021

## 2018-10-29 NOTE — ED Triage Notes (Signed)
Pt presents with cough, ongoing fever and headache.

## 2018-10-29 NOTE — Discharge Instructions (Signed)
Alternate tylenol and motrin every 4 hours to help with the headache. Start flonase, atrovent nasal spray for nasal congestion/drainage. You can use over the counter nasal saline rinse such as neti pot for nasal congestion. Keep hydrated, your urine should be clear to pale yellow in color. Monitor for any worsening of symptoms, chest pain, shortness of breath, wheezing, swelling of the throat, follow up for reevaluation.

## 2019-08-05 ENCOUNTER — Encounter (HOSPITAL_COMMUNITY): Payer: Self-pay

## 2019-08-05 ENCOUNTER — Ambulatory Visit (HOSPITAL_COMMUNITY)
Admission: EM | Admit: 2019-08-05 | Discharge: 2019-08-05 | Disposition: A | Discharge status: Home / Self Care | Payer: Medicaid Other | Attending: Urgent Care | Admitting: Urgent Care

## 2019-08-05 ENCOUNTER — Other Ambulatory Visit: Payer: Self-pay

## 2019-08-05 DIAGNOSIS — Z79899 Other long term (current) drug therapy: Secondary | ICD-10-CM | POA: Insufficient documentation

## 2019-08-05 DIAGNOSIS — R0989 Other specified symptoms and signs involving the circulatory and respiratory systems: Secondary | ICD-10-CM | POA: Diagnosis not present

## 2019-08-05 DIAGNOSIS — Z20828 Contact with and (suspected) exposure to other viral communicable diseases: Secondary | ICD-10-CM | POA: Insufficient documentation

## 2019-08-05 DIAGNOSIS — J069 Acute upper respiratory infection, unspecified: Secondary | ICD-10-CM | POA: Insufficient documentation

## 2019-08-05 NOTE — ED Provider Notes (Signed)
MRN: [id number] DOB: Nov 25, 1975  Subjective:   Shannon Dennis is a 43 y.o. female presenting for 5-day history of runny and stuffy nose, difficulty breathing through her nose.  She has not tried any medications over-the-counter because she is breast-feeding and is wary of taking medicines in general.  She has practice social distancing, no known COVID-19 contacts.  Denies history of allergies, asthma.  No current facility-administered medications for this encounter.   Current Outpatient Medications:  .  acetaminophen (TYLENOL) 500 MG tablet, Take 2 tablets (1,000 mg total) by mouth every 8 (eight) hours as needed for mild pain or headache., Disp: 30 tablet, Rfl: 0 .  ferrous sulfate 325 (65 FE) MG tablet, Take 1 tablet (325 mg total) by mouth 2 (two) times daily with a meal., Disp: 60 tablet, Rfl: 3 .  fluticasone (FLONASE) 50 MCG/ACT nasal spray, Place 2 sprays into both nostrils daily., Disp: 1 g, Rfl: 0 .  hydrochlorothiazide (HYDRODIURIL) 25 MG tablet, Take 1 tablet (25 mg total) by mouth daily., Disp: 40 tablet, Rfl: 0 .  ibuprofen (ADVIL,MOTRIN) 800 MG tablet, Take 1 tablet (800 mg total) by mouth 3 (three) times daily., Disp: 21 tablet, Rfl: 0 .  ipratropium (ATROVENT) 0.06 % nasal spray, Place 2 sprays into both nostrils 4 (four) times daily., Disp: 15 mL, Rfl: 0 .  NIFEdipine (ADALAT CC) 60 MG 24 hr tablet, Take 1 tablet (60 mg total) by mouth daily. Take this medication as well as the hydrochlorothiazide.  Do not take labetalol., Disp: 40 tablet, Rfl: 1 .  oxyCODONE (OXY IR/ROXICODONE) 5 MG immediate release tablet, Take 1 tablet (5 mg total) by mouth every 4 (four) hours as needed for breakthrough pain., Disp: 20 tablet, Rfl: 0 .  Prenatal Vit-Fe Fumarate-FA (PRENATAL VITAMIN PO), Take by mouth., Disp: , Rfl:    No Known Allergies  Past Medical History:  Diagnosis Date  . Gestational diabetes 2012     Past Surgical History:  Procedure Laterality Date  . BREAST SURGERY     right   . CESAREAN SECTION     one previous  . CESAREAN SECTION N/A 08/02/2015   Procedure: CESAREAN SECTION;  Surgeon: Hoover Browns, MD;  Location: [facility];  Service: Obstetrics;  Laterality: N/A;  . CESAREAN SECTION N/A 09/02/2018   Procedure: REPEAT CESAREAN SECTION;  Surgeon: Silverio Lay, MD;  Location: [facility];  Service: Obstetrics;  Laterality: N/A;  RNFA Requested  . INCISION AND DRAINAGE BREAST ABSCESS     left    Review of Systems  Constitutional: Negative for fever and malaise/fatigue.  HENT: Positive for congestion. Negative for ear pain, sinus pain and sore throat.   Eyes: Negative for blurred vision, double vision, discharge and redness.  Respiratory: Negative for cough, hemoptysis, shortness of breath and wheezing.   Cardiovascular: Negative for chest pain.  Gastrointestinal: Negative for abdominal pain, diarrhea, nausea and vomiting.  Genitourinary: Negative for dysuria, flank pain and hematuria.  Musculoskeletal: Negative for myalgias.  Skin: Negative for rash.  Neurological: Negative for dizziness, weakness and headaches.  Psychiatric/Behavioral: Negative for depression and substance abuse.    Objective:   Vitals: BP 124/82 (BP Location: Left Arm)   Pulse 68   Temp 98.1 F (36.7 C) (Temporal)   Resp 16   SpO2 100%   Physical Exam Constitutional:      General: She is not in acute distress.    Appearance: Normal appearance. She is well-developed. She is not ill-appearing, toxic-appearing or diaphoretic.  HENT:  Head: Normocephalic and atraumatic.     Right Ear: Tympanic membrane and ear canal normal. No drainage or tenderness. No middle ear effusion. Tympanic membrane is not erythematous.     Left Ear: Tympanic membrane and ear canal normal. No drainage or tenderness.  No middle ear effusion. Tympanic membrane is not erythematous.     Nose: Congestion present. No rhinorrhea.     Mouth/Throat:     Mouth: Mucous membranes are moist. No oral lesions.      Pharynx: No pharyngeal swelling, oropharyngeal exudate, posterior oropharyngeal erythema or uvula swelling.     Tonsils: No tonsillar exudate or tonsillar abscesses.     Comments: Some postnasal drainage in oropharynx. Eyes:     Extraocular Movements: Extraocular movements intact.     Right eye: Normal extraocular motion.     Left eye: Normal extraocular motion.     Conjunctiva/sclera: Conjunctivae normal.     Pupils: Pupils are equal, round, and reactive to light.  Neck:     Musculoskeletal: Normal range of motion and neck supple.  Cardiovascular:     Rate and Rhythm: Normal rate and regular rhythm.     Pulses: Normal pulses.     Heart sounds: Normal heart sounds. No murmur. No friction rub. No gallop.   Pulmonary:     Effort: Pulmonary effort is normal. No respiratory distress.     Breath sounds: Normal breath sounds. No stridor. No wheezing, rhonchi or rales.  Lymphadenopathy:     Cervical: No cervical adenopathy.  Skin:    General: Skin is warm and dry.     Findings: No rash.  Neurological:     General: No focal deficit present.     Mental Status: She is alert and oriented to person, place, and time.  Psychiatric:        Mood and Affect: Mood normal.        Behavior: Behavior normal.        Thought Content: Thought content normal.      Assessment and Plan :   1. Viral URI   2. Runny nose   3. Breast feeding status of mother     Will manage for viral illness, low suspicion for COVID-19. Counseled patient on nature of COVID-19 including modes of transmission, diagnostic testing, management and supportive care.  Offered symptomatic relief and recommended medications safe while breast-feeding. COVID 19 testing is pending. Counseled patient on potential for adverse effects with medications prescribed/recommended today, ER and return-to-clinic precautions discussed, patient verbalized understanding.     Jaynee Eagles, PA-C 08/05/19 1240

## 2019-08-05 NOTE — ED Triage Notes (Signed)
Pt states she has a runny nose x 5 days. Pt states she is breastfeeding as well.

## 2019-08-05 NOTE — Discharge Instructions (Signed)
We will manage this as a viral syndrome. For sore throat or cough try using a honey-based tea. Use 3 teaspoons of honey with juice squeezed from half lemon. Place shaved pieces of ginger into 1/2-1 cup of water and warm over stove top. Then mix the ingredients and repeat every 4 hours as needed. Please take ibuprofen 400mg  every 6 hours alternating with OR taken together with Tylenol 500mg  every 6 hours. Hydrate very well with at least 2 liters of water. Eat light meals such as soups to replenish electrolytes and soft fruits, veggies. Start an antihistamine like Claritin (loratadine) at 10mg  once daily for postnasal drainage, sinus congestion.  You can take this together with pseudoephedrine (Sudafed) at a dose of 60 mg 3 times a day or 120 mg twice daily as needed for the same kind of congestion.  However, do not take Sudafed if you have high blood pressure or are prone to palpitations, have abnormal heart rhythms.

## 2019-08-06 LAB — NOVEL CORONAVIRUS, NAA (HOSP ORDER, SEND-OUT TO REF LAB; TAT 18-24 HRS): SARS-CoV-2, NAA: NOT DETECTED

## 2020-09-10 ENCOUNTER — Encounter (HOSPITAL_COMMUNITY): Payer: Self-pay | Admitting: Emergency Medicine

## 2020-09-10 ENCOUNTER — Emergency Department (HOSPITAL_COMMUNITY)
Admission: EM | Admit: 2020-09-10 | Discharge: 2020-09-10 | Disposition: A | Discharge status: Home / Self Care | Payer: Medicaid Other | Attending: Emergency Medicine | Admitting: Emergency Medicine

## 2020-09-10 DIAGNOSIS — F4321 Adjustment disorder with depressed mood: Secondary | ICD-10-CM

## 2020-09-10 DIAGNOSIS — F4320 Adjustment disorder, unspecified: Secondary | ICD-10-CM | POA: Insufficient documentation

## 2020-09-10 MED ORDER — LORAZEPAM 1 MG PO TABS
1.0000 mg | ORAL_TABLET | Freq: Once | ORAL | Status: AC
  Administered 2020-09-10: 1 mg via ORAL
  Filled 2020-09-10: qty 1

## 2020-09-10 MED ORDER — HYDROXYZINE HCL 25 MG PO TABS: 25.0000 mg | ORAL_TABLET | Freq: Four times a day (QID) | ORAL | 0 refills | Status: AC | PRN

## 2020-09-10 NOTE — ED Notes (Signed)
Patient/husband verbalized understanding of dc instructions, vss, taken out via wheelchair with husband at bedside.

## 2020-09-10 NOTE — Discharge Instructions (Addendum)
You will need to follow-up with your primary care provider next week regarding today's encounter.   It is understandable for you to be feeling the way that you are feeling given what transpired.  I am very sorry for your loss.  I prescribed you a course of hydroxyzine which you can take as needed for anxiety.  Please understand that this can make you drowsy.  You were also given Ativan here in the ED.  Do not drive and I encourage you to go home and get rest.  Please return to the ED or seek immediate medical attention should you experience any new or worsening symptoms.  This includes any thoughts of self-harm, chest pain, or difficulty breathing.

## 2020-09-10 NOTE — ED Triage Notes (Signed)
Pt arrives via [facility] from home, pt moaning and crying upon arrival, per ems pt found out her brother passed away approx 1 hour ago, pt curled up on the floor and has been in previously said state, since. EMS VSS. resp e/u.

## 2020-09-10 NOTE — ED Provider Notes (Signed)
[facility] EMERGENCY DEPARTMENT Provider Note   CSN: [id number] Arrival date & time: 09/10/20  0915     History Chief Complaint  Patient presents with  . Anxiety    Shannon Dennis is a 44 y.o. female with no relevant past medical history who presents to the ED via EMS for a panic attack.  I obtained history from EMS reports that patient does not have any significant past medical history and does not take any medications regularly.  She evidently was in perfectly good health until she received a phone call this morning sadly informing her that her brother has passed away in another country.  She then was observed by her husband to "crumpled" to the floor.  She did not fall hard or sustain any head injury or LOC.  Since then, she has been crying.  She has been nonverbal and is only crying per EMS.  According to husband, she had not been sick or had any medical complaints prior to her receiving the phone call this morning informing her of the unfortunate news.  Patient is accompanied by her brother who is at bedside.  They both received a phone call from their home country of [place] and their other brother had unfortunately passed away.  The brother who is now at bedside did not know what to do when she started crying and fell to the floor, so he called 911 for an ambulance.  He cannot tell me specifically why she needs to be evaluated here in the ER, but he did not know what else to do.  Patient is still in hysterics and crying uncontrollably.  She is able to speak English, as can the brother at bedside.  However, she is not readily answering questions as she is actively in the grieving process.    Patient is adamant that she is not pregnant.  HPI     Past Medical History:  Diagnosis Date  . Gestational diabetes 2012    Patient Active Problem List   Diagnosis Date Noted  . Gestational hypertension with significant proteinuria, delivered 09/05/2018  . Anemia affecting  pregnancy 09/04/2018  . GDM (gestational diabetes mellitus) 09/02/2018  . Cesarean delivery delivered 08/02/2015  . Gestational diabetes--diet controlled 07/28/2015  . Previous cesarean section x 2 07/28/2015  . AMA (advanced maternal age) multigravida 35+ 07/28/2015  . 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 BREAST SURGERY     right  . CESAREAN SECTION     one previous  . CESAREAN SECTION N/A 08/02/2015   Procedure: CESAREAN SECTION;  Surgeon: Hoover Browns, MD;  Location: [facility];  Service: Obstetrics;  Laterality: N/A;  . CESAREAN SECTION N/A 09/02/2018   Procedure: REPEAT CESAREAN SECTION;  Surgeon: Silverio Lay, MD;  Location: [facility] BIRTHING SUITES;  Service: Obstetrics;  Laterality: N/A;  RNFA Requested  . INCISION AND DRAINAGE BREAST ABSCESS     left     OB History    Gravida  4   Para  3   Term  3   Preterm      AB      Living  3     SAB      TAB      Ectopic      Multiple  0   Live Births  3           Family History  Problem Relation Age of Onset  . Diabetes Mother   .  Hypertension Mother   . Diabetes Father   . Hypertension Father     Social History   Tobacco Use  . Smoking status: Never Smoker  . Smokeless tobacco: Never Used  Vaping Use  . Vaping Use: Never used  Substance Use Topics  . Alcohol use: No  . Drug use: No    Home Medications Prior to Admission medications   Medication Sig Start Date End Date Taking? Authorizing Provider  acetaminophen (TYLENOL) 500 MG tablet Take 2 tablets (1,000 mg total) by mouth every 8 (eight) hours as needed for mild pain or headache. 10/29/18   Cathie Hoops, Amy V, PA-C  ferrous sulfate 325 (65 FE) MG tablet Take 1 tablet (325 mg total) by mouth 2 (two) times daily with a meal. 09/05/18   Altamese Cabal, CNM  fluticasone (FLONASE) 50 MCG/ACT nasal spray Place 2 sprays into both nostrils daily. 10/29/18   Cathie Hoops, Amy V, PA-C  hydrochlorothiazide  (HYDRODIURIL) 25 MG tablet Take 1 tablet (25 mg total) by mouth daily. 09/08/18 10/18/18  Hoover Browns, MD  hydrOXYzine (ATARAX/VISTARIL) 25 MG tablet Take 1 tablet (25 mg total) by mouth every 6 (six) hours as needed. 09/10/20   Lorelee New, PA-C  ibuprofen (ADVIL,MOTRIN) 800 MG tablet Take 1 tablet (800 mg total) by mouth 3 (three) times daily. 10/29/18   Cathie Hoops, Amy V, PA-C  ipratropium (ATROVENT) 0.06 % nasal spray Place 2 sprays into both nostrils 4 (four) times daily. 10/29/18   Cathie Hoops, Amy V, PA-C  NIFEdipine (ADALAT CC) 60 MG 24 hr tablet Take 1 tablet (60 mg total) by mouth daily. Take this medication as well as the hydrochlorothiazide.  Do not take labetalol. 09/08/18   Hoover Browns, MD  oxyCODONE (OXY IR/ROXICODONE) 5 MG immediate release tablet Take 1 tablet (5 mg total) by mouth every 4 (four) hours as needed for breakthrough pain. 09/05/18   Altamese Cabal, CNM  Prenatal Vit-Fe Fumarate-FA (PRENATAL VITAMIN PO) Take by mouth.    [provider]    Allergies    Patient has no known allergies.  Review of Systems   Review of Systems  All other systems reviewed and are negative.   Physical Exam Updated Vital Signs BP (!) 147/89   Pulse 93   Temp 99.1 F (37.3 C) (Oral)   Resp 20   SpO2 99%   Physical Exam Vitals and nursing note reviewed. Exam conducted with a chaperone present.  Constitutional:      Appearance: She is not ill-appearing.     Comments: Actively tearful, grieving.  HENT:     Head: Normocephalic and atraumatic.  Eyes:     General: No scleral icterus.    Conjunctiva/sclera: Conjunctivae normal.  Cardiovascular:     Rate and Rhythm: Normal rate.     Pulses: Normal pulses.  Pulmonary:     Effort: Pulmonary effort is normal. No respiratory distress.  Skin:    General: Skin is dry.     Capillary Refill: Capillary refill takes less than 2 seconds.  Neurological:     Mental Status: She is alert and oriented to person, place, and time.     GCS: GCS eye  subscore is 4. GCS verbal subscore is 5. GCS motor subscore is 6.  Psychiatric:        Mood and Affect: Mood normal.        Behavior: Behavior normal.        Thought Content: Thought content normal.     ED Results /  Procedures / Treatments   Labs (all labs ordered are listed, but only abnormal results are displayed) Labs Reviewed - No data to display  EKG None  Radiology No results found.  Procedures Procedures (including critical care time)  Medications Ordered in ED Medications  LORazepam (ATIVAN) tablet 1 mg (1 mg Oral Given 09/10/20 1034)    ED Course  I have reviewed the triage vital signs and the nursing notes.  Pertinent labs & imaging results that were available during my care of the patient were reviewed by me and considered in my medical decision making (see chart for details).    MDM Rules/Calculators/A&P                          Patient was tearful and upset during initial evaluation.  She was borderline hysterical, understandable given the loss of her brother.  She was provided 1 mg Ativan p.o. here in the ED.  On reassessment, patient is feeling much improved.  Brother states that he and his sister are prepared for discharge.  I asked the patient how she is feeling, and although she remains tearful, she nods her head and states that she is feeling improved.  She states that she does have outpatient primary care support.  She denies any history of depression or other mental health disease.  No active suicidal ideation and she does not want to harm her self or others.  She denies any illicit drug use.  She has no complaints at this time.  Specifically, she denies any chest pain, shortness of breath, difficulty breathing, palpitations, or injury from when she went to the floor after receiving the bad news regarding her brother.  She does not take any medications regularly and denies any significant past medical history.  Given that she likely just needs to grieve, I  feel as though it is reasonable to discharge patient home with close outpatient follow-up.  I emphasized the importance of strict ED return precautions should she develop any worsening symptoms.  We will discharge her home with a course of hydroxyzine.  Advised her that, like the Ativan, can make her drowsy.  Her brother will drive her home.  An interpreter was not utilized during this examination as both her and her brother can speak [place] well.  They voiced understanding regarding assessment, plan, and return precautions.  Final Clinical Impression(s) / ED Diagnoses Final diagnoses:  Grieving    Rx / DC Orders ED Discharge Orders         Ordered    hydrOXYzine (ATARAX/VISTARIL) 25 MG tablet  Every 6 hours PRN        09/10/20 1103           Lorelee New, PA-C 09/10/20 1103    Jacalyn Lefevre, MD 09/10/20 1104

## 2020-09-10 NOTE — ED Notes (Signed)
Patient's husband at bedside, advised this RN that patient has no prior history of depression and became overcome with grief upon discovering that her brother had passed away this morning.

## 2021-01-12 ENCOUNTER — Ambulatory Visit (INDEPENDENT_AMBULATORY_CARE_PROVIDER_SITE_OTHER): Payer: Medicaid Other

## 2021-01-12 ENCOUNTER — Other Ambulatory Visit (HOSPITAL_COMMUNITY)
Admission: RE | Admit: 2021-01-12 | Discharge: 2021-01-12 | Disposition: A | Discharge status: Home / Self Care | Payer: Medicaid Other | Source: Ambulatory Visit

## 2021-01-12 ENCOUNTER — Other Ambulatory Visit: Payer: Self-pay

## 2021-01-12 VITALS — BP 139/84 | HR 77 | Wt 189.8 lb

## 2021-01-12 DIAGNOSIS — Z01419 Encounter for gynecological examination (general) (routine) without abnormal findings: Secondary | ICD-10-CM | POA: Insufficient documentation

## 2021-01-12 DIAGNOSIS — Z1239 Encounter for other screening for malignant neoplasm of breast: Secondary | ICD-10-CM

## 2021-01-12 DIAGNOSIS — Z975 Presence of (intrauterine) contraceptive device: Secondary | ICD-10-CM

## 2021-01-12 DIAGNOSIS — Z3046 Encounter for surveillance of implantable subdermal contraceptive: Secondary | ICD-10-CM

## 2021-01-12 DIAGNOSIS — Z124 Encounter for screening for malignant neoplasm of cervix: Secondary | ICD-10-CM

## 2021-01-12 DIAGNOSIS — N852 Hypertrophy of uterus: Secondary | ICD-10-CM

## 2021-01-12 NOTE — Patient Instructions (Signed)
[employer]  Healthy Eating Following a healthy eating pattern may help you to achieve and maintain a healthy body weight, reduce the risk of chronic disease, and live a long and productive life. It is important to follow a healthy eating pattern at an appropriate calorie level for your body. Your nutritional needs should be met primarily through food by choosing a variety of nutrient-rich foods. What are tips for following this plan? Reading food labels  Read labels and choose the following: ? Reduced or low sodium. ? Juices with 100% fruit juice. ? Foods with low saturated fats and high polyunsaturated and monounsaturated fats. ? Foods with whole grains, such as whole wheat, cracked wheat, brown rice, and wild rice. ? Whole grains that are fortified with folic acid. This is recommended for women who are pregnant or who want to become pregnant.  Read labels and avoid the following: ? Foods with a lot of added sugars. These include foods that contain brown sugar, corn sweetener, corn syrup, dextrose, fructose, glucose, high-fructose corn syrup, honey, invert sugar, lactose, malt syrup, maltose, molasses, raw sugar, sucrose, trehalose, or turbinado sugar.  Do not eat more than the following amounts of added sugar per day:  6 teaspoons (25 g) for women.  9 teaspoons (38 g) for men. ? Foods that contain processed or refined starches and grains. ? Refined grain products, such as white flour, degermed cornmeal, white bread, and white rice. Shopping  Choose nutrient-rich snacks, such as vegetables, whole fruits, and nuts. Avoid high-calorie and high-sugar snacks, such as potato chips, fruit snacks, and candy.  Use oil-based dressings and spreads on foods instead of solid fats such as butter, stick margarine, or cream cheese.  Limit pre-made sauces, mixes, and "instant" products such as flavored rice, instant noodles, and ready-made  pasta.  Try more plant-protein sources, such as tofu, tempeh, black beans, edamame, lentils, nuts, and seeds.  Explore eating plans such as the Mediterranean diet or vegetarian diet. Cooking  Use oil to saut or stir-fry foods instead of solid fats such as butter, stick margarine, or lard.  Try baking, boiling, grilling, or broiling instead of frying.  Remove the fatty part of meats before cooking.  Steam vegetables in water or broth. Meal planning  At meals, imagine dividing your plate into fourths: ? One-half of your plate is fruits and vegetables. ? One-fourth of your plate is whole grains. ? One-fourth of your plate is protein, especially lean meats, poultry, eggs, tofu, beans, or nuts.  Include low-fat dairy as part of your daily diet.   Lifestyle  Choose healthy options in all settings, including home, work, school, restaurants, or stores.  Prepare your food safely: ? Wash your hands after handling raw meats. ? Keep food preparation surfaces clean by regularly washing with hot, soapy water. ? Keep raw meats separate from ready-to-eat foods, such as fruits and vegetables. ? Cook seafood, meat, poultry, and eggs to the recommended internal temperature. ? Store foods at safe temperatures. In general:  Keep cold foods at 38F (4.4C) or below.  Keep hot foods at 138F (60C) or above.  Keep your freezer at [facility] (-17.8C) or below.  Foods are no longer safe to eat when they have been between the temperatures of 40-138F (4.4-60C) for more than 2 hours. What foods should I eat? Fruits Aim to eat 2 cup-equivalents of fresh, canned (in natural juice), or frozen fruits each day. Examples of 1 cup-equivalent of fruit include 1 small apple, 8 large strawberries, 1 cup canned  fruit,  cup dried fruit, or 1 cup 100% juice. Vegetables Aim to eat 2-3 cup-equivalents of fresh and frozen vegetables each day, including different varieties and colors. Examples of 1 cup-equivalent of  vegetables include 2 medium carrots, 2 cups raw, leafy greens, 1 cup chopped vegetable (raw or cooked), or 1 medium baked potato. Grains Aim to eat 6 ounce-equivalents of whole grains each day. Examples of 1 ounce-equivalent of grains include 1 slice of bread, 1 cup ready-to-eat cereal, 3 cups popcorn, or  cup cooked rice, pasta, or cereal. Meats and other proteins Aim to eat 5-6 ounce-equivalents of protein each day. Examples of 1 ounce-equivalent of protein include 1 egg, 1/2 cup nuts or seeds, or 1 tablespoon (16 g) peanut butter. A cut of meat or fish that is the size of a deck of cards is about 3-4 ounce-equivalents.  Of the protein you eat each week, try to have at least 8 ounces come from seafood. This includes salmon, trout, herring, and anchovies. Dairy Aim to eat 3 cup-equivalents of fat-free or low-fat dairy each day. Examples of 1 cup-equivalent of dairy include 1 cup (240 mL) milk, 8 ounces (250 g) yogurt, 1 ounces (44 g) natural cheese, or 1 cup (240 mL) fortified soy milk. Fats and oils  Aim for about 5 teaspoons (21 g) per day. Choose monounsaturated fats, such as canola and olive oils, avocados, peanut butter, and most nuts, or polyunsaturated fats, such as sunflower, corn, and soybean oils, walnuts, pine nuts, sesame seeds, sunflower seeds, and flaxseed. Beverages  Aim for six 8-oz glasses of water per day. Limit coffee to three to five 8-oz cups per day.  Limit caffeinated beverages that have added calories, such as soda and energy drinks.  Limit alcohol intake to no more than 1 drink a day for nonpregnant women and 2 drinks a day for men. One drink equals 12 oz of beer (355 mL), 5 oz of wine (148 mL), or 1 oz of hard liquor (44 mL). Seasoning and other foods  Avoid adding excess amounts of salt to your foods. Try flavoring foods with herbs and spices instead of salt.  Avoid adding sugar to foods.  Try using oil-based dressings, sauces, and spreads instead of solid  fats. This information is based on general [place] nutrition guidelines. For more information, visit [employer].es. Exact amounts may vary based on your nutrition needs. Summary  A healthy eating plan may help you to maintain a healthy weight, reduce the risk of chronic diseases, and stay active throughout your life.  Plan your meals. Make sure you eat the right portions of a variety of nutrient-rich foods.  Try baking, boiling, grilling, or broiling instead of frying.  Choose healthy options in all settings, including home, work, school, restaurants, or stores. This information is not intended to replace advice given to you by your health care provider. Make sure you discuss any questions you have with your health care provider. Document Revised: 02/10/2018 Document Reviewed: 02/10/2018 Elsevier Patient Education  [place].

## 2021-01-12 NOTE — Progress Notes (Signed)
GYNECOLOGY OFFICE VISIT NOTE-WELL WOMAN EXAM  History:   Shannon Dennis [id number] here today for increased cycle length and heavy bleeding. She states she has a Nexplanon in place since delivery 2 years ago.  She states her periods are monthly and are usually 5-7 days with no clots or cramping, but always heavy to moderate flow.  However, she states in Nov. she took "3 tablets to stop my period while I went to [place]."  She states that she does not know what the tablets were and they were given to her by her sister who is a Librarian, academic.  Patient reports she had no cycle for December and January and when her menses returned in Feb it lasted 15 days.  She continues to deny cramping.  She also denies pain or discomfort during sexual activity. She denies any abnormal vaginal discharge,  pelvic pain or other concerns.   Birth Control: Nexplanon  Reproductive Concerns: Partners in last year: One-Female-Husband STD Testing: Declines Breast Exams: Performs SBE every couple of months.  She displays breast awareness. She denies a family history of breast, uterine, cervical, or ovarian cancer.  Medical and Nutrition: PCP: None Exercise: "Trying to" walks on treadmill or outside ~2days/week Tobacco/Drugs/Alcohol: Denies Nutrition: "trying to" when asked about balanced diet. Reports desire to start losing weight. Requests information on nutrition courses.   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   Past Medical History:  Diagnosis Date  . Gestational diabetes 2012    Past Surgical History:  Procedure Laterality Date  . BREAST SURGERY     right  . CESAREAN SECTION     one previous  . CESAREAN SECTION N/A 08/02/2015   Procedure: CESAREAN SECTION;  Surgeon: Hoover Browns, MD;  Location: [facility];  Service: Obstetrics;  Laterality: N/A;  . CESAREAN SECTION N/A 09/02/2018   Procedure: REPEAT CESAREAN  SECTION;  Surgeon: Silverio Lay, MD;  Location: [facility] BIRTHING SUITES;  Service: Obstetrics;  Laterality: N/A;  RNFA Requested  . INCISION AND DRAINAGE BREAST ABSCESS     left    The following portions of the patient's history were reviewed and updated as appropriate: allergies, current medications, past family history, past medical history, past social history, past surgical history and problem list.   Health Maintenance:  Pap collected.  Normal mammogram in 2014. Due for reassessment.   Review of Systems:  Pertinent items noted in HPI and remainder of comprehensive ROS otherwise negative.    Objective:    Physical Exam BP 139/84   Pulse 77   Wt 189 lb 12.8 oz (86.1 kg)   LMP 12/28/2020   BMI 33.62 kg/m  Physical Exam Vitals reviewed. Exam conducted with a chaperone present.  Constitutional:      General: She is not in acute distress.    Appearance: Normal appearance. She is obese. She is not toxic-appearing.  HENT:     Head: Normocephalic and atraumatic.  Eyes:     Conjunctiva/sclera: Conjunctivae normal.  Neck:     Thyroid: No thyroid mass or thyroid tenderness.  Cardiovascular:     Rate and Rhythm: Normal rate and regular rhythm.     Heart sounds: Normal heart sounds.  Pulmonary:     Effort: Pulmonary effort is normal. No respiratory distress.     Breath sounds: Normal breath sounds.  Chest:  Breasts:     Right: No mass, skin change or tenderness.  Left: No mass, skin change or tenderness.      Comments: CBE completed Abdominal:     General: Bowel sounds are normal.     Palpations: Abdomen is soft.     Tenderness: There is no abdominal tenderness.  Genitourinary:    Labia:        Right: No tenderness.        Left: No tenderness.      Vagina: No vaginal discharge.     Cervix: No cervical motion tenderness.     Uterus: Enlarged (Size of 16-18wk gravid uterus).      Comments: Cervix severely posterior.  XL Pederson utilized which allowed for partial  visualization of cervix and collection of Pap. Pap collected with broom, brush x 2.   Musculoskeletal:        General: Normal range of motion.     Cervical back: Normal range of motion.  Skin:    General: Skin is warm and dry.  Neurological:     Mental Status: She is alert and oriented to person, place, and time.  Psychiatric:        Mood and Affect: Mood normal.        Behavior: Behavior normal.        Thought Content: Thought content normal.      Labs and Imaging No results found for this or any previous visit (from the past 168 hour(s)). No results found.   Assessment & Plan:  45 year old Female Well Woman Exam Menorrhagia Uterine Enlargement  1. Encounter for well woman exam with routine gynecological exam -Exam performed and findings discussed. -Encouraged to activate Mychart. -Educated on AHA exercise recommendations of 30 minutes of moderate to vigorous activity at least 5x/week.  2. Routine cervical smear -Pap smear collected, with difficulty, and pending. -Encouraged to inform future providers of posterior location of cervix requiring longer speculum.  -Educated on ASCCP guidelines regarding pap smear evaluation and frequency. -Informed of turnover time and provider/clinic policy on releasing results.  3. Encounter for screening breast examination -Encouraged to continue SBE with increased breast awareness including examination of breast for skin changes, moles, tenderness, etc.  -Informed that due to insurance type she would need to get mammogram scholarship or use [facility]. -Will send message to [facility] staff with patient information and need for mammogram.  4. Nexplanon in place -Patient reports in left arm.  5. Uterine enlargement -Informed of findings. -Educated on possible causes including fibroids or adenomyosis. Discussed how these conditions can contribute to c/o heavy and prolonged menstrual bleeding. -Discussed need for [place] to confirm suspicions and  patient encouraged to call office if/when she secures insurance to cover costs.     Routine preventative health maintenance measures emphasized. Please refer to After Visit Summary for other counseling recommendations.   No follow-ups on file.      Cherre Robins, CNM 01/12/2021

## 2021-01-12 NOTE — Progress Notes (Signed)
New Patient is in the office for GYN visit. Currently has nexplanon, in place for since 2020 Reports heavy cycles sometimes lasting 15 days LMP 12-28-20

## 2021-01-16 LAB — CYTOLOGY - PAP
Adequacy: ABSENT
Comment: NEGATIVE
Diagnosis: NEGATIVE
High risk HPV: NEGATIVE

## 2021-03-20 ENCOUNTER — Other Ambulatory Visit: Payer: Self-pay | Admitting: Obstetrics and Gynecology

## 2021-03-20 DIAGNOSIS — Z1231 Encounter for screening mammogram for malignant neoplasm of breast: Secondary | ICD-10-CM

## 2021-04-13 ENCOUNTER — Encounter (INDEPENDENT_AMBULATORY_CARE_PROVIDER_SITE_OTHER): Payer: Self-pay

## 2021-04-13 ENCOUNTER — Ambulatory Visit
Admission: RE | Admit: 2021-04-13 | Discharge: 2021-04-13 | Disposition: A | Discharge status: Home / Self Care | Payer: No Typology Code available for payment source | Source: Ambulatory Visit | Attending: Obstetrics and Gynecology | Admitting: Obstetrics and Gynecology

## 2021-04-13 ENCOUNTER — Other Ambulatory Visit: Payer: Self-pay

## 2021-04-13 ENCOUNTER — Ambulatory Visit: Payer: No Typology Code available for payment source | Admitting: *Deleted

## 2021-04-13 VITALS — BP 122/74 | Wt 188.5 lb

## 2021-04-13 DIAGNOSIS — Z1231 Encounter for screening mammogram for malignant neoplasm of breast: Secondary | ICD-10-CM

## 2021-04-13 DIAGNOSIS — Z1239 Encounter for other screening for malignant neoplasm of breast: Secondary | ICD-10-CM

## 2021-04-13 NOTE — Patient Instructions (Signed)
Explained breast self awareness with Karin Golden. Patient did not need a Pap smear today due to last Pap smear and HPV typing was 01/12/2021. Let her know BCCCP will cover Pap smears and HPV typing every 5 years unless has a history of abnormal Pap smears. Referred patient to the [facility] for a screening mammogram on the mobile unit. Appointment scheduled Thursday, April 13, 2021 at 1100. Patient aware of appointment and will be there. Let patient know the Breast Center will follow up with her within the next couple weeks with results of her mammogram by letter or phone. Karin Golden verbalized understanding.  Jammy Stlouis, [name], RN 10:56 AM

## 2021-04-13 NOTE — Progress Notes (Signed)
[name] is a 45 y.o. female who presents to [facility] clinic today with no complaints.    Pap Smear: Pap smear not completed today. Last Pap smear was 01/12/2021 at [facility] for [facility] clinic and was normal with negative HPV. Per patient has no history of an abnormal Pap smear. Last Pap smear result is available in Epic.   Physical exam: Breasts Right breast slightly larger than left breast that per patient is normal for her. No skin abnormalities bilateral breasts. No nipple retraction bilateral breasts. No nipple discharge bilateral breasts. No lymphadenopathy. No lumps palpated bilateral breasts. No complaints of pain or tenderness on exam.       Pelvic/Bimanual Pap is not indicated today per BCCCP guidelines.   Smoking History: Patient has never smoked.   Patient Navigation: Patient education provided. Access to services provided for patient through BCCCP program.     Breast and Cervical Cancer Risk Assessment: Patient does not have family history of breast cancer, known genetic mutations, or radiation treatment to the chest before age 76. Patient does not have history of cervical dysplasia, immunocompromised, or DES exposure in-utero.  Risk Assessment    Risk Scores      04/13/2021   Last edited by: Narda Rutherford, LPN   5-year risk: 0.6 %   Lifetime risk: 7.4 %          A: BCCCP exam without pap smear No complaints.  P: Referred patient to the [facility] for a screening mammogram on the mobile unit. Appointment scheduled Thursday, April 13, 2021 at 1100.  Priscille Heidelberg, RN 04/13/2021 10:56 AM

## 2021-09-18 ENCOUNTER — Other Ambulatory Visit: Payer: Self-pay

## 2021-09-18 ENCOUNTER — Encounter (HOSPITAL_COMMUNITY): Payer: Self-pay

## 2021-09-18 ENCOUNTER — Ambulatory Visit (HOSPITAL_COMMUNITY)
Admission: EM | Admit: 2021-09-18 | Discharge: 2021-09-18 | Disposition: A | Discharge status: Home / Self Care | Payer: No Typology Code available for payment source | Attending: Physician Assistant | Admitting: Physician Assistant

## 2021-09-18 DIAGNOSIS — Z20822 Contact with and (suspected) exposure to covid-19: Secondary | ICD-10-CM | POA: Insufficient documentation

## 2021-09-18 DIAGNOSIS — J069 Acute upper respiratory infection, unspecified: Secondary | ICD-10-CM | POA: Insufficient documentation

## 2021-09-18 LAB — POC INFLUENZA A AND B ANTIGEN (URGENT CARE ONLY)
INFLUENZA A ANTIGEN, POC: NEGATIVE
INFLUENZA B ANTIGEN, POC: NEGATIVE

## 2021-09-18 NOTE — ED Provider Notes (Signed)
[place]    CSN: [id number] Arrival date & time: 09/18/21  1523      History   Chief Complaint Chief Complaint  Patient presents with   URI    HPI Shannon Dennis is a 45 y.o. female.   Pt reports fever, body aches, congestion, and sore throat that started yesterday.  Denies n/v/d, cough, shortness of breath.  She has taken nothing for the sx.  She has not had her flu vaccine.     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    Past Surgical History:  Procedure Laterality Date   BREAST SURGERY     right   CESAREAN SECTION     one previous   CESAREAN SECTION N/A 08/02/2015   Procedure: CESAREAN SECTION;  Surgeon: Waymon Amato, MD;  Location: [facility];  Service: Obstetrics;  Laterality: N/A;   CESAREAN SECTION N/A 09/02/2018   Procedure: REPEAT CESAREAN SECTION;  Surgeon: Delsa Bern, MD;  Location: [place];  Service: Obstetrics;  Laterality: N/A;  RNFA Requested   INCISION AND DRAINAGE BREAST ABSCESS     left    OB History     Gravida  4   Para  3   Term  3   Preterm      AB      Living  3      SAB      IAB      Ectopic      Multiple  0   Live Births  3            Home Medications    Prior to Admission medications   Medication Sig Start Date End Date Taking? Authorizing Provider  acetaminophen (TYLENOL) 500 MG tablet Take 2 tablets (1,000 mg total) by mouth every 8 (eight) hours as needed for mild pain or headache. Patient not  taking: Reported on 01/12/2021 10/29/18   Ok Edwards, PA-C  ferrous sulfate 325 (65 FE) MG tablet Take 1 tablet (325 mg total) by mouth 2 (two) times daily with a meal. Patient not taking: Reported on 01/12/2021 09/05/18   Shirley Friar, CNM  fluticasone [facility]) 50 MCG/ACT nasal spray Place 2 sprays into both nostrils daily. Patient not taking: Reported on 01/12/2021 10/29/18   Ok Edwards, PA-C  hydrochlorothiazide (HYDRODIURIL) 25 MG tablet Take 1 tablet (25 mg total) by mouth daily. 09/08/18 10/18/18  Waymon Amato, MD  hydrOXYzine (ATARAX/VISTARIL) 25 MG tablet Take 1 tablet (25 mg total) by mouth every 6 (six) hours as needed. Patient not taking: Reported on 01/12/2021 09/10/20   Corena Herter, PA-C  ibuprofen (ADVIL,MOTRIN) 800 MG tablet Take 1 tablet (800 mg total) by mouth 3 (three) times daily. 10/29/18   Tasia Catchings, Amy V, PA-C  ipratropium (ATROVENT) 0.06 % nasal spray Place 2 sprays into both nostrils 4 (four) times daily. Patient not taking: Reported on 01/12/2021 10/29/18   Ok Edwards, PA-C  NIFEdipine (ADALAT CC) 60 MG 24 hr tablet Take 1 tablet (60 mg total) by mouth daily. Take this medication  as well as the hydrochlorothiazide.  Do not take labetalol. Patient not taking: Reported on 01/12/2021 09/08/18   Hoover Browns, MD  oxyCODONE (OXY IR/ROXICODONE) 5 MG immediate release tablet Take 1 tablet (5 mg total) by mouth every 4 (four) hours as needed for breakthrough pain. Patient not taking: Reported on 01/12/2021 09/05/18   Altamese Cabal, CNM  Prenatal Vit-Fe Fumarate-FA (PRENATAL VITAMIN PO) Take by mouth. Patient not taking: Reported on 01/12/2021    [provider]    Family History Family History  Problem Relation Age of Onset   Diabetes Mother    Hypertension Mother    Diabetes Father    Hypertension Father    Kidney failure Brother    Kidney failure Brother     Social History Social History   Tobacco Use   Smoking status: Never   Smokeless tobacco: Never  Vaping Use   Vaping  Use: Never used  Substance Use Topics   Alcohol use: No   Drug use: No     Allergies   Patient has no known allergies.   Review of Systems Review of Systems  Constitutional:  Positive for chills, fatigue and fever.  HENT:  Positive for congestion. Negative for ear pain, sinus pressure, sinus pain and sore throat.   Eyes:  Negative for pain and visual disturbance.  Respiratory:  Negative for cough, shortness of breath and wheezing.   Cardiovascular:  Negative for chest pain and palpitations.  Gastrointestinal:  Negative for abdominal pain and vomiting.  Genitourinary:  Negative for dysuria and hematuria.  Musculoskeletal:  Positive for myalgias. Negative for arthralgias and back pain.  Skin:  Negative for color change and rash.  Neurological:  Negative for seizures and syncope.  All other systems reviewed and are negative.   Physical Exam Triage Vital Signs ED Triage Vitals  Enc Vitals Group     BP 09/18/21 1725 (!) 146/92     Pulse Rate 09/18/21 1725 90     Resp 09/18/21 1725 18     Temp 09/18/21 1725 98.7 F (37.1 C)     Temp Source 09/18/21 1725 Oral     SpO2 09/18/21 1725 100 %     Weight --      Height --      Head Circumference --      Peak Flow --      Pain Score 09/18/21 1728 3     Pain Loc --      Pain Edu? --      Excl. in GC? --    No data found.  Updated Vital Signs BP (!) 146/92 (BP Location: Right Arm)   Pulse 90   Temp 98.7 F (37.1 C) (Oral)   Resp 18   LMP 08/31/2021   SpO2 100%   Visual Acuity Right Eye Distance:   Left Eye Distance:   Bilateral Distance:    Right Eye Near:   Left Eye Near:    Bilateral Near:     Physical Exam Vitals and nursing note reviewed.  Constitutional:      General: She is not in acute distress.    Appearance: She is well-developed. She is ill-appearing.  HENT:     Head: Normocephalic and atraumatic.  Eyes:     Conjunctiva/sclera: Conjunctivae normal.  Cardiovascular:     Rate and Rhythm: Normal rate  and regular rhythm.     Heart sounds: No murmur heard. Pulmonary:     Effort: Pulmonary effort is normal. No respiratory distress.  Breath sounds: Normal breath sounds.  Abdominal:     Palpations: Abdomen is soft.     Tenderness: There is no abdominal tenderness.  Musculoskeletal:     Cervical back: Neck supple.  Skin:    General: Skin is warm.  Neurological:     Mental Status: She is alert.     UC Treatments / Results  Labs (all labs ordered are listed, but only abnormal results are displayed) Labs Reviewed  SARS CORONAVIRUS 2 (TAT 6-24 HRS)  POC INFLUENZA A AND B ANTIGEN (URGENT CARE ONLY)    EKG   Radiology No results found.  Procedures Procedures (including critical care time)  Medications Ordered in UC Medications - No data to display  Initial Impression / Assessment and Plan / UC Course  I have reviewed the triage vital signs and the nursing notes.  Pertinent labs & imaging results that were available during my care of the patient were reviewed by me and considered in my medical decision making (see chart for details).     URI, Negative for flu, COVID pending.  Advised supportive care, drink plenty of fluids, Tylenol as needed.  Return precautions discussed.  Final Clinical Impressions(s) / UC Diagnoses   Final diagnoses:  Viral upper respiratory tract infection     Discharge Instructions      Recommend Tylenol as needed Drink plenty of fluids Return for evaluation if your symptoms become worse  COVID test pending      ED Prescriptions   None    PDMP not reviewed this encounter.   Ward, Lenise Arena, PA-C 09/18/21 1836

## 2021-09-18 NOTE — Discharge Instructions (Addendum)
Recommend Tylenol as needed Drink plenty of fluids Return for evaluation if your symptoms become worse  COVID test pending

## 2021-09-18 NOTE — ED Triage Notes (Signed)
Pt presents with congestion and weakness since yesterday.

## 2021-09-19 LAB — SARS CORONAVIRUS 2 (TAT 6-24 HRS): SARS Coronavirus 2: NEGATIVE

## 2022-05-02 ENCOUNTER — Encounter (HOSPITAL_COMMUNITY): Payer: Self-pay

## 2022-05-02 ENCOUNTER — Ambulatory Visit (HOSPITAL_COMMUNITY)
Admission: EM | Admit: 2022-05-02 | Discharge: 2022-05-02 | Disposition: A | Discharge status: Home / Self Care | Payer: No Typology Code available for payment source | Attending: Emergency Medicine | Admitting: Emergency Medicine

## 2022-05-02 DIAGNOSIS — N939 Abnormal uterine and vaginal bleeding, unspecified: Secondary | ICD-10-CM | POA: Insufficient documentation

## 2022-05-02 DIAGNOSIS — M545 Low back pain, unspecified: Secondary | ICD-10-CM | POA: Insufficient documentation

## 2022-05-02 LAB — CBC WITH DIFFERENTIAL/PLATELET
Abs Immature Granulocytes: 0.01 10*3/uL (ref 0.00–0.07)
Basophils Absolute: 0 10*3/uL (ref 0.0–0.1)
Basophils Relative: 0 %
Eosinophils Absolute: 0 10*3/uL (ref 0.0–0.5)
Eosinophils Relative: 1 %
HCT: 39.6 % (ref 36.0–46.0)
Hemoglobin: 13.5 g/dL (ref 12.0–15.0)
Immature Granulocytes: 0 %
Lymphocytes Relative: 46 %
Lymphs Abs: 2.9 10*3/uL (ref 0.7–4.0)
MCH: 28.5 pg (ref 26.0–34.0)
MCHC: 34.1 g/dL (ref 30.0–36.0)
MCV: 83.5 fL (ref 80.0–100.0)
Monocytes Absolute: 0.3 10*3/uL (ref 0.1–1.0)
Monocytes Relative: 5 %
Neutro Abs: 3 10*3/uL (ref 1.7–7.7)
Neutrophils Relative %: 48 %
Platelets: 253 10*3/uL (ref 150–400)
RBC: 4.74 MIL/uL (ref 3.87–5.11)
RDW: 12.7 % (ref 11.5–15.5)
WBC: 6.2 10*3/uL (ref 4.0–10.5)
nRBC: 0 % (ref 0.0–0.2)

## 2022-05-02 LAB — POC URINE PREG, ED: Preg Test, Ur: NEGATIVE

## 2022-05-02 LAB — POCT URINALYSIS DIPSTICK, ED / UC
Bilirubin Urine: NEGATIVE
Glucose, UA: 100 mg/dL — AB
Ketones, ur: NEGATIVE mg/dL
Leukocytes,Ua: NEGATIVE
Nitrite: NEGATIVE
Protein, ur: NEGATIVE mg/dL
Specific Gravity, Urine: 1.025 (ref 1.005–1.030)
Urobilinogen, UA: 0.2 mg/dL (ref 0.0–1.0)
pH: 6 (ref 5.0–8.0)

## 2022-05-02 MED ORDER — IBUPROFEN 800 MG PO TABS
800.0000 mg | ORAL_TABLET | Freq: Once | ORAL | Status: AC
  Administered 2022-05-02: 800 mg via ORAL

## 2022-05-02 MED ORDER — IBUPROFEN 800 MG PO TABS
ORAL_TABLET | ORAL | Status: AC
  Filled 2022-05-02: qty 1

## 2022-05-02 NOTE — ED Provider Notes (Signed)
[facility]    CSN: [id number] Arrival date & time: 05/02/22  1801      History   Chief Complaint Chief Complaint  Patient presents with   menstrual bleeding   Back Pain    HPI Shannon Dennis is a 46 y.o. female.  Presents on her menstrual cycle with concerns for increased heavy bleeding.  She has regular cycles and at the start of this long she noticed heavier bleeding than normal.  Reports needing to change a pad every 2 hours.  Some small clots.  Denies any abdominal pain or cramping, urinary symptoms, shortness of breath, dizziness or lightheadedness.  She does get lower back pain with her cycles.  Has tried occasional ibuprofen with minimal relief.  No known history of fibroids, endometriosis, anemia.  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    Past Surgical History:  Procedure Laterality Date   BREAST SURGERY     right   CESAREAN SECTION     one previous   CESAREAN SECTION N/A 08/02/2015   Procedure: CESAREAN SECTION;  Surgeon: Hoover Browns, MD;  Location: [facility];  Service: Obstetrics;  Laterality: N/A;   CESAREAN SECTION N/A 09/02/2018   Procedure: REPEAT CESAREAN SECTION;  Surgeon: Silverio Lay, MD;  Location: [facility];  Service: Obstetrics;  Laterality: N/A;  RNFA Requested   INCISION AND DRAINAGE BREAST ABSCESS     left    OB History     Gravida  4   Para  3   Term  3   Preterm      AB      Living  3      SAB      IAB      Ectopic       Multiple  0   Live Births  3            Home Medications    Prior to Admission medications   Medication Sig Start Date End Date Taking? Authorizing Provider  acetaminophen (TYLENOL) 500 MG tablet Take 2 tablets (1,000 mg total) by mouth every 8 (eight) hours as needed for mild pain or headache. Patient not taking: Reported on 01/12/2021 10/29/18   Belinda Fisher, PA-C  ferrous sulfate 325 (65 FE) MG tablet Take 1 tablet (325 mg total) by mouth 2 (two) times daily with a meal. Patient not taking: Reported on 01/12/2021 09/05/18   Altamese Cabal, CNM  fluticasone [facility]) 50 MCG/ACT nasal spray Place 2 sprays into both nostrils daily. Patient not taking: Reported on 01/12/2021 10/29/18   Belinda Fisher, PA-C  hydrochlorothiazide (HYDRODIURIL) 25 MG tablet Take 1 tablet (25 mg total) by mouth daily. Patient not taking: Reported on 05/02/2022 09/08/18 10/18/18  Hoover Browns, MD  hydrOXYzine (ATARAX/VISTARIL) 25 MG tablet Take 1 tablet (25 mg total) by mouth every 6 (six) hours as needed. Patient not taking: Reported on 01/12/2021 09/10/20   Lorelee New, PA-C  ibuprofen (ADVIL,MOTRIN) 800 MG tablet Take 1 tablet (800 mg total) by mouth 3 (three) times daily. Patient not  taking: Reported on 05/02/2022 10/29/18   Belinda Fisher, PA-C  ipratropium (ATROVENT) 0.06 % nasal spray Place 2 sprays into both nostrils 4 (four) times daily. Patient not taking: Reported on 01/12/2021 10/29/18   Belinda Fisher, PA-C  NIFEdipine (ADALAT CC) 60 MG 24 hr tablet Take 1 tablet (60 mg total) by mouth daily. Take this medication as well as the hydrochlorothiazide.  Do not take labetalol. Patient not taking: Reported on 01/12/2021 09/08/18   Hoover Browns, MD  oxyCODONE (OXY IR/ROXICODONE) 5 MG immediate release tablet Take 1 tablet (5 mg total) by mouth every 4 (four) hours as needed for breakthrough pain. Patient not taking: Reported on 01/12/2021 09/05/18   Altamese Cabal, CNM  Prenatal Vit-Fe Fumarate-FA (PRENATAL VITAMIN PO) Take by  mouth. Patient not taking: Reported on 01/12/2021    [provider]    Family History Family History  Problem Relation Age of Onset   Diabetes Mother    Hypertension Mother    Diabetes Father    Hypertension Father    Kidney failure Brother    Kidney failure Brother     Social History Social History   Tobacco Use   Smoking status: Never   Smokeless tobacco: Never  Vaping Use   Vaping Use: Never used  Substance Use Topics   Alcohol use: No   Drug use: No     Allergies   Patient has no known allergies.   Review of Systems Review of Systems  Musculoskeletal:  Positive for back pain.   Per HPI  Physical Exam Triage Vital Signs ED Triage Vitals  Enc Vitals Group     BP 05/02/22 1839 130/78     Pulse Rate 05/02/22 1838 74     Resp 05/02/22 1838 14     Temp 05/02/22 1838 97.9 F (36.6 C)     Temp Source 05/02/22 1838 Oral     SpO2 05/02/22 1838 100 %     Weight --      Height --      Head Circumference --      Peak Flow --      Pain Score 05/02/22 1839 8     Pain Loc --      Pain Edu? --      Excl. in GC? --    No data found.  Updated Vital Signs BP 130/78   Pulse 74   Temp 97.9 F (36.6 C) (Oral)   Resp 14   SpO2 100%    Physical Exam Vitals and nursing note reviewed.  Constitutional:      General: She is not in acute distress. HENT:     Nose: Nose normal.     Mouth/Throat:     Mouth: Mucous membranes are moist.     Pharynx: Oropharynx is clear.  Eyes:     Extraocular Movements: Extraocular movements intact.     Conjunctiva/sclera: Conjunctivae normal.     Pupils: Pupils are equal, round, and reactive to light.  Cardiovascular:     Rate and Rhythm: Normal rate and regular rhythm.     Heart sounds: Normal heart sounds.  Pulmonary:     Effort: Pulmonary effort is normal.     Breath sounds: Normal breath sounds.  Abdominal:     Palpations: Abdomen is soft.     Tenderness: There is no abdominal tenderness.  Musculoskeletal:         General: Normal range of motion.     Cervical back: Normal range of  motion.  Lymphadenopathy:     Cervical: No cervical adenopathy.  Neurological:     Mental Status: She is alert and oriented to person, place, and time.     Cranial Nerves: Cranial nerves 2-12 are intact.     Sensory: Sensation is intact.     Motor: Motor function is intact. No weakness or pronator drift.     Coordination: Coordination is intact. Romberg sign negative.     Gait: Gait is intact.     Deep Tendon Reflexes: Reflexes are normal and symmetric.     Comments: Strength 5/5 all extremities     UC Treatments / Results  Labs (all labs ordered are listed, but only abnormal results are displayed) Labs Reviewed  POCT URINALYSIS DIPSTICK, ED / UC - Abnormal; Notable for the following components:      Result Value   Glucose, UA 100 (*)    Hgb urine dipstick MODERATE (*)    All other components within normal limits  CBC WITH DIFFERENTIAL/PLATELET  POC URINE PREG, ED    EKG  Radiology No results found.  Procedures Procedures (including critical care time)  Medications Ordered in UC Medications  ibuprofen (ADVIL) tablet 800 mg (800 mg Oral Given 05/02/22 1959)    Initial Impression / Assessment and Plan / UC Course  I have reviewed the triage vital signs and the nursing notes.  Pertinent labs & imaging results that were available during my care of the patient were reviewed by me and considered in my medical decision making (see chart for details).  Physical exam unremarkable, she appears well.  Ibuprofen every 6 hours for her back pain during her cycles.  Dose of ibuprofen was given today with much improvement in the low back pain.  In regards to the menstrual bleeding, I recommend she follow-up with her gynecologist.  No concerning symptoms for acute blood loss at this time.  We are waiting on CBC.  Urinalysis unremarkable apart from moderate hemoglobin.  Urine pregnancy negative. Return precautions  discussed. Patient agrees to plan and is discharged in stable condition.  Final Clinical Impressions(s) / UC Diagnoses   Final diagnoses:  Acute bilateral low back pain without sciatica  Menstrual bleeding problem     Discharge Instructions      I recommend taking ibuprofen every 6 hours for your back pain during your cycles.  You will receive the results of your blood work within the next 1 to 2 days.  We will call you if anything is abnormal.  Please follow-up with your gynecologist regarding your increased bleeding.  If you develop any worsening symptoms please go to the emergency department.     ED Prescriptions   None    PDMP not reviewed this encounter.   Latausha Flamm, Ray Church 05/02/22 2022

## 2022-05-02 NOTE — Discharge Instructions (Signed)
I recommend taking ibuprofen every 6 hours for your back pain during your cycles.  You will receive the results of your blood work within the next 1 to 2 days.  We will call you if anything is abnormal.  Please follow-up with your gynecologist regarding your increased bleeding.  If you develop any worsening symptoms please go to the emergency department.

## 2022-05-02 NOTE — ED Triage Notes (Signed)
Pt presents with heavy menstrual bleeding x 4 days.

## 2023-01-02 ENCOUNTER — Other Ambulatory Visit: Payer: Self-pay | Admitting: Obstetrics and Gynecology

## 2023-01-02 DIAGNOSIS — Z1231 Encounter for screening mammogram for malignant neoplasm of breast: Secondary | ICD-10-CM

## 2023-01-05 ENCOUNTER — Ambulatory Visit: Admission: RE | Admit: 2023-01-05 | Payer: No Typology Code available for payment source | Source: Ambulatory Visit

## 2024-03-30 ENCOUNTER — Encounter: Payer: Self-pay | Admitting: Obstetrics and Gynecology

## 2024-03-30 ENCOUNTER — Telehealth: Payer: Self-pay

## 2024-03-30 ENCOUNTER — Ambulatory Visit (INDEPENDENT_AMBULATORY_CARE_PROVIDER_SITE_OTHER): Payer: Self-pay | Admitting: Obstetrics and Gynecology

## 2024-03-30 ENCOUNTER — Other Ambulatory Visit: Payer: Self-pay

## 2024-03-30 VITALS — BP 141/93 | HR 74 | Wt 201.0 lb

## 2024-03-30 DIAGNOSIS — Z1331 Encounter for screening for depression: Secondary | ICD-10-CM

## 2024-03-30 DIAGNOSIS — Z0000 Encounter for general adult medical examination without abnormal findings: Secondary | ICD-10-CM

## 2024-03-30 DIAGNOSIS — Z975 Presence of (intrauterine) contraceptive device: Secondary | ICD-10-CM

## 2024-03-30 NOTE — Progress Notes (Signed)
 NEW GYNECOLOGY PATIENT Patient name: Shannon Dennis MRN [id number]  Date of birth: 1976-09-27 Chief Complaint:   Gynecologic Exam     History:  Shannon Dennis is a 48 y.o. [phone] being seen today for discussion of nexplanon removal.    Prolonged cycles with the nexplaon - up to 10 days of bleeding at a time. Bleeding starts as bright red, transitions to a darker color and then bright again at the end. Does not want a new device. Sexually active and will switch to NFP. Nexplanon has been in place for 5 years   Gynecologic History Patient's last menstrual period was 03/09/2024 (within days). Contraception: Nexplanon Last Pap:     Component Value Date/Time   DIAGPAP  01/12/2021 1209    - Negative for intraepithelial lesion or malignancy (NILM)   HPVHIGH Negative 01/12/2021 1209   ADEQPAP  01/12/2021 1209    Satisfactory for evaluation; transformation zone component ABSENT.    Last Mammogram: 04/2021 BIRADS 1 Last Colonoscopy: n/a  Obstetric History OB History  Gravida Para Term Preterm AB Living  4 4 4   4   SAB IAB Ectopic Multiple Live Births     0 4    # Outcome Date GA Lbr Len/2nd Weight Sex Type Anes PTL Lv  4 Term 2019     CS-LTranv   LIV  3 Term 08/02/15 [redacted]w[redacted]d  7 lb 1.8 oz (3.225 kg) M CS-Vac Spinal  LIV  2 Term 2012     CS-LTranv   LIV  1 Term 2009     CS-LTranv   LIV    Past Medical History:  Diagnosis Date   Gestational diabetes 2012    Past Surgical History:  Procedure Laterality Date   BREAST SURGERY     right   CESAREAN SECTION     one previous   CESAREAN SECTION N/A 08/02/2015   Procedure: CESAREAN SECTION;  Surgeon: Vernal Gold, MD;  Location: [facility];  Service: Obstetrics;  Laterality: N/A;   CESAREAN SECTION N/A 09/02/2018   Procedure: REPEAT CESAREAN SECTION;  Surgeon: Ona Bidding, MD;  Location: [facility];  Service: Obstetrics;  Laterality: N/A;  RNFA Requested   INCISION AND DRAINAGE BREAST ABSCESS     left    Current Outpatient  Medications on File Prior to Visit  Medication Sig Dispense Refill   acetaminophen  (TYLENOL ) 500 MG tablet Take 2 tablets (1,000 mg total) by mouth every 8 (eight) hours as needed for mild pain or headache. (Patient not taking: Reported on 03/30/2024) 30 tablet 0   ferrous sulfate  325 (65 FE) MG tablet Take 1 tablet (325 mg total) by mouth 2 (two) times daily with a meal. (Patient not taking: Reported on 03/30/2024) 60 tablet 3   fluticasone  (FLONASE ) 50 MCG/ACT nasal spray Place 2 sprays into both nostrils daily. (Patient not taking: Reported on 03/30/2024) 1 g 0   hydrochlorothiazide  (HYDRODIURIL ) 25 MG tablet Take 1 tablet (25 mg total) by mouth daily. (Patient not taking: Reported on 05/02/2022) 40 tablet 0   hydrOXYzine  (ATARAX /VISTARIL ) 25 MG tablet Take 1 tablet (25 mg total) by mouth every 6 (six) hours as needed. (Patient not taking: Reported on 03/30/2024) 12 tablet 0   ibuprofen  (ADVIL ,MOTRIN ) 800 MG tablet Take 1 tablet (800 mg total) by mouth 3 (three) times daily. (Patient not taking: Reported on 03/30/2024) 21 tablet 0   ipratropium (ATROVENT ) 0.06 % nasal spray Place 2 sprays into both nostrils 4 (four) times daily. (Patient not taking: Reported on  03/30/2024) 15 mL 0   NIFEdipine  (ADALAT  CC) 60 MG 24 hr tablet Take 1 tablet (60 mg total) by mouth daily. Take this medication as well as the hydrochlorothiazide .  Do not take labetalol . (Patient not taking: Reported on 03/30/2024) 40 tablet 1   oxyCODONE  (OXY IR/ROXICODONE ) 5 MG immediate release tablet Take 1 tablet (5 mg total) by mouth every 4 (four) hours as needed for breakthrough pain. (Patient not taking: Reported on 03/30/2024) 20 tablet 0   Prenatal Vit-Fe Fumarate-FA (PRENATAL VITAMIN PO) Take by mouth. (Patient not taking: Reported on 03/30/2024)     No current facility-administered medications on file prior to visit.    No Known Allergies  Social History:  reports that she has never smoked. She has never used smokeless tobacco. She  reports that she does not drink alcohol and does not use drugs.  Family History  Problem Relation Age of Onset   Diabetes Mother    Hypertension Mother    Diabetes Father    Hypertension Father    Kidney failure Brother    Kidney failure Brother     The following portions of the patient's history were reviewed and updated as appropriate: allergies, current medications, past family history, past medical history, past social history, past surgical history and problem list.  Review of Systems Pertinent items noted in HPI and remainder of comprehensive ROS otherwise negative.  Physical Exam:  BP (!) 144/79   Pulse 71   Wt 201 lb (91.2 kg)   LMP 03/09/2024 (Within Days)   BMI 35.61 kg/m  Physical Exam Vitals and nursing note reviewed.  Constitutional:      Appearance: Normal appearance.  Cardiovascular:     Rate and Rhythm: Normal rate.  Pulmonary:     Effort: Pulmonary effort is normal.  Neurological:     General: No focal deficit present.     Mental Status: She is alert and oriented to person, place, and time.  Psychiatric:        Mood and Affect: Mood normal.        Behavior: Behavior normal.        Thought Content: Thought content normal.        Judgment: Judgment normal.      Assessment and Plan:   1. Nexplanon in place (Primary) Patient would prefer to have nexplanon removed and proceed with NFP as new contraceptive method. Due to cost, patient will follow up with health department for removal.   2. Healthcare maintenance Due for mammogram, will schedule through [facility].    Routine preventative health maintenance measures emphasized. Please refer to After Visit Summary for other counseling recommendations.   Follow-up: No follow-ups on file.      Kiki Pelton, MD Obstetrician & Gynecologist, Faculty [facility] [employer], [facility]

## 2024-03-30 NOTE — Telephone Encounter (Signed)
 Telephoned patient using language line interpreter#[id number]. Left a voice message with [facility] contact information.

## 2024-10-01 ENCOUNTER — Telehealth: Payer: Self-pay

## 2024-10-01 NOTE — Telephone Encounter (Signed)
 Telephoned patient at mobile number using interpreter#[id number]. Left a voice message with [facility] (scholarship) scheduling contact information. Telephoned spouse and he will give her the same message.
